# Patient Record
Sex: Female | Born: 1975
Health system: Southern US, Community
[De-identification: ages and names within clinical notes are randomized; demographics above are authoritative.]

## PROBLEM LIST (undated history)

## (undated) DIAGNOSIS — R112 Nausea with vomiting, unspecified: Secondary | ICD-10-CM

## (undated) DIAGNOSIS — F419 Anxiety disorder, unspecified: Secondary | ICD-10-CM

## (undated) DIAGNOSIS — B009 Herpesviral infection, unspecified: Secondary | ICD-10-CM

## (undated) DIAGNOSIS — Z9889 Other specified postprocedural states: Secondary | ICD-10-CM

## (undated) HISTORY — DX: Herpesviral infection, unspecified: B00.9

## (undated) HISTORY — PX: BREAST BIOPSY: SHX20

## (undated) HISTORY — PX: WISDOM TOOTH EXTRACTION: SHX21

## (undated) HISTORY — DX: Anxiety disorder, unspecified: F41.9

---

## 1998-12-22 ENCOUNTER — Ambulatory Visit (HOSPITAL_COMMUNITY): Admission: RE | Admit: 1998-12-22 | Discharge: 1998-12-22 | Payer: Self-pay | Admitting: Obstetrics and Gynecology

## 1999-03-05 ENCOUNTER — Inpatient Hospital Stay (HOSPITAL_COMMUNITY): Admission: AD | Admit: 1999-03-05 | Discharge: 1999-03-07 | Payer: Self-pay | Admitting: Obstetrics and Gynecology

## 2000-10-10 ENCOUNTER — Encounter: Payer: Self-pay | Admitting: Obstetrics and Gynecology

## 2000-10-10 ENCOUNTER — Ambulatory Visit (HOSPITAL_COMMUNITY): Admission: RE | Admit: 2000-10-10 | Discharge: 2000-10-10 | Payer: Self-pay | Admitting: Obstetrics and Gynecology

## 2001-02-27 ENCOUNTER — Encounter: Payer: Self-pay | Admitting: Obstetrics and Gynecology

## 2001-02-27 ENCOUNTER — Inpatient Hospital Stay (HOSPITAL_COMMUNITY): Admission: RE | Admit: 2001-02-27 | Discharge: 2001-02-27 | Payer: Self-pay | Admitting: Obstetrics and Gynecology

## 2001-03-08 ENCOUNTER — Inpatient Hospital Stay (HOSPITAL_COMMUNITY): Admission: AD | Admit: 2001-03-08 | Discharge: 2001-03-10 | Payer: Self-pay | Admitting: Obstetrics and Gynecology

## 2002-01-15 ENCOUNTER — Ambulatory Visit (HOSPITAL_COMMUNITY): Admission: RE | Admit: 2002-01-15 | Discharge: 2002-01-15 | Payer: Self-pay | Admitting: Family Medicine

## 2002-01-15 ENCOUNTER — Encounter: Payer: Self-pay | Admitting: Family Medicine

## 2002-01-24 ENCOUNTER — Encounter: Payer: Self-pay | Admitting: Family Medicine

## 2002-01-24 ENCOUNTER — Encounter: Admission: RE | Admit: 2002-01-24 | Discharge: 2002-01-24 | Payer: Self-pay | Admitting: Family Medicine

## 2002-05-25 ENCOUNTER — Other Ambulatory Visit: Admission: RE | Admit: 2002-05-25 | Discharge: 2002-05-25 | Payer: Self-pay | Admitting: Obstetrics and Gynecology

## 2002-10-24 ENCOUNTER — Encounter: Payer: Self-pay | Admitting: Internal Medicine

## 2002-10-24 ENCOUNTER — Encounter: Admission: RE | Admit: 2002-10-24 | Discharge: 2002-10-24 | Payer: Self-pay | Admitting: Internal Medicine

## 2003-03-22 ENCOUNTER — Other Ambulatory Visit: Admission: RE | Admit: 2003-03-22 | Discharge: 2003-03-22 | Payer: Self-pay | Admitting: Obstetrics and Gynecology

## 2003-05-13 ENCOUNTER — Ambulatory Visit (HOSPITAL_COMMUNITY): Admission: RE | Admit: 2003-05-13 | Discharge: 2003-05-13 | Payer: Self-pay | Admitting: Obstetrics and Gynecology

## 2003-05-13 ENCOUNTER — Encounter: Payer: Self-pay | Admitting: Obstetrics and Gynecology

## 2003-09-27 ENCOUNTER — Inpatient Hospital Stay (HOSPITAL_COMMUNITY): Admission: AD | Admit: 2003-09-27 | Discharge: 2003-09-29 | Payer: Self-pay | Admitting: Obstetrics and Gynecology

## 2004-06-25 ENCOUNTER — Other Ambulatory Visit: Admission: RE | Admit: 2004-06-25 | Discharge: 2004-06-25 | Payer: Self-pay | Admitting: Obstetrics and Gynecology

## 2004-07-07 ENCOUNTER — Encounter: Admission: RE | Admit: 2004-07-07 | Discharge: 2004-07-07 | Payer: Self-pay | Admitting: Obstetrics and Gynecology

## 2004-10-21 ENCOUNTER — Encounter: Admission: RE | Admit: 2004-10-21 | Discharge: 2004-10-21 | Payer: Self-pay | Admitting: Obstetrics and Gynecology

## 2005-03-10 ENCOUNTER — Encounter: Admission: RE | Admit: 2005-03-10 | Discharge: 2005-03-10 | Payer: Self-pay | Admitting: Obstetrics and Gynecology

## 2005-07-16 ENCOUNTER — Encounter: Admission: RE | Admit: 2005-07-16 | Discharge: 2005-07-16 | Payer: Self-pay | Admitting: Obstetrics and Gynecology

## 2005-08-13 ENCOUNTER — Other Ambulatory Visit: Admission: RE | Admit: 2005-08-13 | Discharge: 2005-08-13 | Payer: Self-pay | Admitting: Obstetrics and Gynecology

## 2005-12-13 ENCOUNTER — Ambulatory Visit: Payer: Self-pay | Admitting: Family Medicine

## 2005-12-20 ENCOUNTER — Ambulatory Visit: Payer: Self-pay

## 2006-01-06 ENCOUNTER — Ambulatory Visit: Payer: Self-pay | Admitting: Gastroenterology

## 2006-05-13 ENCOUNTER — Ambulatory Visit: Payer: Self-pay | Admitting: Gastroenterology

## 2006-05-13 ENCOUNTER — Encounter: Admission: RE | Admit: 2006-05-13 | Discharge: 2006-05-13 | Payer: Self-pay | Admitting: Obstetrics and Gynecology

## 2006-09-09 ENCOUNTER — Ambulatory Visit: Payer: Self-pay | Admitting: Family Medicine

## 2006-09-12 ENCOUNTER — Ambulatory Visit (HOSPITAL_COMMUNITY): Admission: RE | Admit: 2006-09-12 | Discharge: 2006-09-12 | Payer: Self-pay | Admitting: Gastroenterology

## 2006-10-07 ENCOUNTER — Ambulatory Visit: Payer: Self-pay | Admitting: Family Medicine

## 2006-10-14 ENCOUNTER — Ambulatory Visit: Payer: Self-pay | Admitting: Family Medicine

## 2006-11-29 ENCOUNTER — Ambulatory Visit: Payer: Self-pay | Admitting: Family Medicine

## 2006-12-23 ENCOUNTER — Ambulatory Visit: Payer: Self-pay | Admitting: Family Medicine

## 2007-01-31 ENCOUNTER — Ambulatory Visit: Payer: Self-pay | Admitting: Cardiology

## 2007-05-05 ENCOUNTER — Ambulatory Visit: Payer: Self-pay | Admitting: Family Medicine

## 2007-05-05 DIAGNOSIS — F411 Generalized anxiety disorder: Secondary | ICD-10-CM | POA: Insufficient documentation

## 2007-05-05 DIAGNOSIS — M542 Cervicalgia: Secondary | ICD-10-CM | POA: Insufficient documentation

## 2007-06-19 ENCOUNTER — Telehealth (INDEPENDENT_AMBULATORY_CARE_PROVIDER_SITE_OTHER): Payer: Self-pay | Admitting: *Deleted

## 2007-07-18 ENCOUNTER — Telehealth (INDEPENDENT_AMBULATORY_CARE_PROVIDER_SITE_OTHER): Payer: Self-pay | Admitting: *Deleted

## 2007-11-23 ENCOUNTER — Telehealth (INDEPENDENT_AMBULATORY_CARE_PROVIDER_SITE_OTHER): Payer: Self-pay | Admitting: *Deleted

## 2008-04-23 ENCOUNTER — Telehealth (INDEPENDENT_AMBULATORY_CARE_PROVIDER_SITE_OTHER): Payer: Self-pay | Admitting: *Deleted

## 2008-06-06 ENCOUNTER — Telehealth (INDEPENDENT_AMBULATORY_CARE_PROVIDER_SITE_OTHER): Payer: Self-pay | Admitting: *Deleted

## 2008-06-07 ENCOUNTER — Ambulatory Visit: Payer: Self-pay | Admitting: Family Medicine

## 2008-10-21 ENCOUNTER — Ambulatory Visit: Payer: Self-pay | Admitting: Family Medicine

## 2008-10-21 DIAGNOSIS — D239 Other benign neoplasm of skin, unspecified: Secondary | ICD-10-CM | POA: Insufficient documentation

## 2008-10-21 LAB — CONVERTED CEMR LAB
Albumin: 4.2 g/dL (ref 3.5–5.2)
Alkaline Phosphatase: 46 units/L (ref 39–117)
Basophils Absolute: 0 10*3/uL (ref 0.0–0.1)
Basophils Relative: 0.1 % (ref 0.0–3.0)
Bilirubin Urine: NEGATIVE
Bilirubin, Direct: 0.2 mg/dL (ref 0.0–0.3)
Blood in Urine, dipstick: NEGATIVE
CO2: 29 meq/L (ref 19–32)
Calcium: 9.2 mg/dL (ref 8.4–10.5)
Creatinine, Ser: 0.7 mg/dL (ref 0.4–1.2)
Eosinophils Absolute: 0.1 10*3/uL (ref 0.0–0.7)
Eosinophils Relative: 1.2 % (ref 0.0–5.0)
Glucose, Urine, Semiquant: NEGATIVE
HCT: 41.9 % (ref 36.0–46.0)
Ketones, urine, test strip: NEGATIVE
LDL Cholesterol: 79 mg/dL (ref 0–99)
Lymphocytes Relative: 29.7 % (ref 12.0–46.0)
MCHC: 35.3 g/dL (ref 30.0–36.0)
MCV: 88.2 fL (ref 78.0–100.0)
Monocytes Absolute: 0.4 10*3/uL (ref 0.1–1.0)
Neutro Abs: 3.2 10*3/uL (ref 1.4–7.7)
Nitrite: NEGATIVE
Protein, U semiquant: NEGATIVE
Sodium: 141 meq/L (ref 135–145)
Specific Gravity, Urine: 1.025
Total Bilirubin: 1.2 mg/dL (ref 0.3–1.2)
Total Protein: 7.1 g/dL (ref 6.0–8.3)
pH: 5

## 2008-10-22 ENCOUNTER — Encounter (INDEPENDENT_AMBULATORY_CARE_PROVIDER_SITE_OTHER): Payer: Self-pay | Admitting: *Deleted

## 2008-10-22 ENCOUNTER — Encounter: Payer: Self-pay | Admitting: Family Medicine

## 2008-10-23 ENCOUNTER — Encounter (INDEPENDENT_AMBULATORY_CARE_PROVIDER_SITE_OTHER): Payer: Self-pay | Admitting: *Deleted

## 2008-11-04 ENCOUNTER — Telehealth (INDEPENDENT_AMBULATORY_CARE_PROVIDER_SITE_OTHER): Payer: Self-pay | Admitting: *Deleted

## 2009-05-09 ENCOUNTER — Telehealth (INDEPENDENT_AMBULATORY_CARE_PROVIDER_SITE_OTHER): Payer: Self-pay | Admitting: *Deleted

## 2009-06-10 ENCOUNTER — Telehealth (INDEPENDENT_AMBULATORY_CARE_PROVIDER_SITE_OTHER): Payer: Self-pay | Admitting: *Deleted

## 2009-06-19 ENCOUNTER — Ambulatory Visit: Payer: Self-pay | Admitting: Family Medicine

## 2009-11-10 ENCOUNTER — Ambulatory Visit: Payer: Self-pay | Admitting: Family Medicine

## 2009-11-10 DIAGNOSIS — N760 Acute vaginitis: Secondary | ICD-10-CM | POA: Insufficient documentation

## 2009-11-10 LAB — CONVERTED CEMR LAB
Blood in Urine, dipstick: NEGATIVE
Glucose, Urine, Semiquant: NEGATIVE
Protein, U semiquant: NEGATIVE
WBC Urine, dipstick: NEGATIVE
Whiff Test: NEGATIVE
pH: 6

## 2009-12-31 ENCOUNTER — Encounter: Payer: Self-pay | Admitting: Family Medicine

## 2010-02-12 ENCOUNTER — Ambulatory Visit: Payer: Self-pay | Admitting: Family Medicine

## 2010-02-12 DIAGNOSIS — Z87898 Personal history of other specified conditions: Secondary | ICD-10-CM | POA: Insufficient documentation

## 2010-02-16 ENCOUNTER — Ambulatory Visit: Payer: Self-pay | Admitting: Family Medicine

## 2010-02-18 ENCOUNTER — Telehealth (INDEPENDENT_AMBULATORY_CARE_PROVIDER_SITE_OTHER): Payer: Self-pay | Admitting: *Deleted

## 2010-02-18 LAB — CONVERTED CEMR LAB
ALT: 15 units/L (ref 0–35)
Albumin: 4.3 g/dL (ref 3.5–5.2)
BUN: 9 mg/dL (ref 6–23)
Basophils Absolute: 0 10*3/uL (ref 0.0–0.1)
Basophils Relative: 0.7 % (ref 0.0–3.0)
Bilirubin, Direct: 0.1 mg/dL (ref 0.0–0.3)
CO2: 27 meq/L (ref 19–32)
Creatinine, Ser: 0.8 mg/dL (ref 0.4–1.2)
Eosinophils Absolute: 0.1 10*3/uL (ref 0.0–0.7)
Eosinophils Relative: 1.1 % (ref 0.0–5.0)
HDL: 60.6 mg/dL (ref >39.00)
LDL Cholesterol: 75 mg/dL (ref 0–99)
Lymphocytes Relative: 26.9 % (ref 12.0–46.0)
MCV: 88.7 fL (ref 78.0–100.0)
Monocytes Absolute: 0.3 10*3/uL (ref 0.1–1.0)
Monocytes Relative: 5.4 % (ref 3.0–12.0)
Neutro Abs: 3.6 10*3/uL (ref 1.4–7.7)
Platelets: 202 10*3/uL (ref 150.0–400.0)
RDW: 14 % (ref 11.5–14.6)
Total Bilirubin: 0.9 mg/dL (ref 0.3–1.2)

## 2010-03-05 ENCOUNTER — Ambulatory Visit: Payer: Self-pay | Admitting: Family Medicine

## 2010-03-05 DIAGNOSIS — J029 Acute pharyngitis, unspecified: Secondary | ICD-10-CM | POA: Insufficient documentation

## 2010-03-06 ENCOUNTER — Encounter: Payer: Self-pay | Admitting: Family Medicine

## 2010-11-23 ENCOUNTER — Telehealth: Payer: Self-pay | Admitting: Family Medicine

## 2010-11-24 NOTE — Progress Notes (Signed)
Summary: Labs   Phone Note Outgoing Call   Call placed by: Army Fossa CMA,  February 18, 2010 9:55 AM Reason for Call: Discuss lab or test results Summary of Call: Regarding lab results, Left message with a female:  HIV, RPR- Normal All other labs also Normal (mailed copy to pt).   Follow-up for Phone Call        Pt is aware. Army Fossa CMA  February 18, 2010 10:15 AM

## 2010-11-24 NOTE — Letter (Signed)
Summary: [facility]  [facility]   Imported By: Lanelle Bal 02/18/2010 12:14:45  _____________________________________________________________________  External Attachment:    Type:   Image     Comment:   External Document

## 2010-11-24 NOTE — Assessment & Plan Note (Signed)
Summary: sore throat/cbs   Vital Signs:  Patient profile:   35 year old female Weight:      105.13 pounds Temp:     97.5 degrees F oral Pulse rate:   76 / minute Pulse rhythm:   regular BP sitting:   106 / 70  (left arm) Cuff size:   regular  Vitals Entered By: Army Fossa CMA (Mar 05, 2010 11:25 AM) CC: Pt here c/o sore throat, body aches since sunday., URI symptoms   History of Present Illness:       This is a 35 year old woman who presents with URI symptoms.  The symptoms began 5 days ago.  The patient complains of sore throat, but denies nasal congestion, clear nasal discharge, purulent nasal discharge, dry cough, productive cough, earache, and sick contacts.  The patient denies fever, low-grade fever (<100.5 degrees), fever of 100.5-103 degrees, fever of 103.1-104 degrees, fever to >104 degrees, stiff neck, dyspnea, wheezing, rash, vomiting, diarrhea, use of an antipyretic, and response to antipyretic.  The patient denies itchy watery eyes, itchy throat, sneezing, seasonal symptoms, response to antihistamine, headache, muscle aches, and severe fatigue.  The patient denies the following risk factors for Strep sinusitis: unilateral facial pain, unilateral nasal discharge, poor response to decongestant, double sickening, tooth pain, Strep exposure, tender adenopathy, and absence of cough.    Allergies (verified): No Known Drug Allergies  Past History:  Past medical, surgical, family and social histories (including risk factors) reviewed for relevance to current acute and chronic problems.  Past Medical History: Reviewed history from 02/12/2010 and no changes required. Anxiety HSV II  Past Surgical History: Reviewed history from 10/21/2008 and no changes required. Denies surgical history  Family History: Reviewed history from 05/05/2007 and no changes required. Family History of Anxiety  Social History: Reviewed history from 10/21/2008 and no changes  required. Occupation:  Preschool Married Current Smoker Alcohol use-no Drug use-no Regular exercise-no  Review of Systems      See HPI  Physical Exam  General:  Well-developed,well-nourished,in no acute distress; alert,appropriate and cooperative throughout examination Ears:  External ear exam shows no significant lesions or deformities.  Otoscopic examination reveals clear canals, tympanic membranes are intact bilaterally without bulging, retraction, inflammation or discharge. Hearing is grossly normal bilaterally. Nose:  External nasal examination shows no deformity or inflammation. Nasal mucosa are pink and moist without lesions or exudates. Mouth:  pharyngeal erythema and postnasal drip.   Neck:  No deformities, masses, or tenderness noted. Lungs:  Normal respiratory effort, chest expands symmetrically. Lungs are clear to auscultation, no crackles or wheezes. Heart:  normal rate and no murmur.   Psych:  Oriented X3 and normally interactive.     Impression & Recommendations:  Problem # 1:  ACUTE PHARYNGITIS (ICD-462)  Her updated medication list for this problem includes:    Amoxicillin 875 Mg Tabs (Amoxicillin) .Marland Kitchen... 1 by mouth two times a day     take zyrtec or allegra daily  Orders: T-Culture, Throat ([id number])  Instructed to complete antibiotics and call if not improved in 48 hours.   Complete Medication List: 1)  Amoxicillin 875 Mg Tabs (Amoxicillin) .Marland Kitchen.. 1 by mouth two times a day Prescriptions: AMOXICILLIN 875 MG TABS (AMOXICILLIN) 1 by mouth two times a day  #20 x 0   Entered by:   Shonna Chock   Authorized by:   Loreen Freud DO   Signed by:   Shonna Chock on 03/05/2010   Method used:   Print then Give  to Patient   RxID:   [id number] AMOXICILLIN 875 MG TABS (AMOXICILLIN) 1 by mouth two times a day  #20 x 0   Entered and Authorized by:   Loreen Freud DO   Signed by:   Loreen Freud DO on 03/05/2010   Method used:   Print then Give to Patient   RxID:    [id number]   Laboratory Results    Other Tests  Rapid Strep: negative Comments: Army Fossa CMA  Mar 05, 2010 11:37 AM

## 2010-11-24 NOTE — Assessment & Plan Note (Signed)
Summary: CPX,FASTING,NO PAP,[employer]/RH......Marland Kitchen   Vital Signs:  Patient profile:   35 year old female Height:      66 inches Weight:      108 pounds Pulse rate:   72 / minute Pulse rhythm:   regular BP sitting:   102 / 62  (left arm) Cuff size:   regular  Vitals Entered By: Army Fossa CMA (February 12, 2010 9:06 AM) CC: Pt here for CPX, not fasting- she states she was not told, no pap   History of Present Illness: Pt here for cpe no pap.  Pt with no complaint  Pt dx with HSV II by gyn --- her husband has it as well.  She would like to be tested for other stds because her and her husband were separated for a while and she is not sure if he was with someone else or not.  Pt was tested for GC and Chlamydia by gyn.    Preventive Screening-Counseling & Management  Alcohol-Tobacco     Alcohol drinks/day: <1     Alcohol type: beer     Smoking Status: current     Smoking Cessation Counseling: yes     Smoke Cessation Stage: contemplative     Packs/Day: <1 cig a day     Year Started: 1990  Caffeine-Diet-Exercise     Caffeine use/day: 1     Does Patient Exercise: yes     Type of exercise: tennis     Exercise (avg: min/session): 30-60     Times/week: 3  Hep-HIV-STD-Contraception     Dental Visit-last 6 months yes     Dental Care Counseling: not indicated; dental care within six months     SBE monthly: no     SBE Education/Counseling: to perform regular SBE      Sexual History:  currently monogamous and they were separated --suspect husband was with someone else.    Current Medications (verified): 1)  None  Allergies (verified): No Known Drug Allergies  Past History:  Past Surgical History: Last updated: 10/21/2008 Denies surgical history  Family History: Last updated: 05/05/2007 Family History of Anxiety  Social History: Last updated: 10/21/2008 Occupation:  Preschool Married Current Smoker Alcohol use-no Drug use-no Regular exercise-no  Risk Factors: Alcohol  Use: <1 (02/12/2010) Caffeine Use: 1 (02/12/2010) Exercise: yes (02/12/2010)  Risk Factors: Smoking Status: current (02/12/2010) Packs/Day: <1 cig a day (02/12/2010)  Past Medical History: Anxiety HSV II  Family History: Reviewed history from 05/05/2007 and no changes required. Family History of Anxiety  Social History: Reviewed history from 10/21/2008 and no changes required. Occupation:  Preschool Married Current Smoker Alcohol use-no Drug use-no Regular exercise-no Does Patient Exercise:  yes Dental Care w/in 6 mos.:  yes Sexual History:  currently monogamous, they were separated --suspect husband was with someone else  Review of Systems      See HPI General:  Denies chills, fatigue, fever, loss of appetite, malaise, sleep disorder, sweats, weakness, and weight loss. Eyes:  Denies blurring, discharge, double vision, eye irritation, eye pain, halos, itching, light sensitivity, red eye, vision loss-1 eye, and vision loss-both eyes. ENT:  Denies decreased hearing, difficulty swallowing, ear discharge, earache, hoarseness, nasal congestion, nosebleeds, postnasal drainage, ringing in ears, sinus pressure, and sore throat. CV:  Denies bluish discoloration of lips or nails, chest pain or discomfort, difficulty breathing at night, difficulty breathing while lying down, fainting, fatigue, leg cramps with exertion, lightheadness, near fainting, palpitations, shortness of breath with exertion, swelling of feet, swelling of hands, and  weight gain. Resp:  Denies chest discomfort, chest pain with inspiration, cough, coughing up blood, excessive snoring, hypersomnolence, morning headaches, pleuritic, shortness of breath, sputum productive, and wheezing. GI:  Denies abdominal pain, bloody stools, change in bowel habits, constipation, dark tarry stools, diarrhea, excessive appetite, gas, hemorrhoids, indigestion, loss of appetite, and nausea. GU:  Denies abnormal vaginal bleeding, decreased  libido, discharge, dysuria, genital sores, hematuria, incontinence, nocturia, urinary frequency, and urinary hesitancy. Derm:  Denies changes in color of skin, changes in nail beds, dryness, excessive perspiration, flushing, hair loss, insect bite(s), itching, lesion(s), poor wound healing, and rash; derm---3 moles removed. Neuro:  Denies brief paralysis, difficulty with concentration, disturbances in coordination, falling down, headaches, inability to speak, memory loss, numbness, poor balance, seizures, sensation of room spinning, tingling, tremors, visual disturbances, and weakness. Psych:  Denies alternate hallucination ( auditory/visual), anxiety, depression, easily angered, easily tearful, irritability, mental problems, panic attacks, sense of great danger, suicidal thoughts/plans, thoughts of violence, unusual visions or sounds, and thoughts /plans of harming others. Endo:  Denies cold intolerance, excessive hunger, excessive thirst, excessive urination, heat intolerance, polyuria, and weight change. Heme:  Denies abnormal bruising, bleeding, enlarge lymph nodes, fevers, pallor, and skin discoloration. Allergy:  Denies hives or rash, itching eyes, persistent infections, seasonal allergies, and sneezing.  Physical Exam  General:  Well-developed,well-nourished,in no acute distress; alert,appropriate and cooperative throughout examination Head:  Normocephalic and atraumatic without obvious abnormalities. No apparent alopecia or balding. Eyes:  pupils equal, pupils round, pupils reactive to light, and no injection.   Ears:  External ear exam shows no significant lesions or deformities.  Otoscopic examination reveals clear canals, tympanic membranes are intact bilaterally without bulging, retraction, inflammation or discharge. Hearing is grossly normal bilaterally. Nose:  External nasal examination shows no deformity or inflammation. Nasal mucosa are pink and moist without lesions or  exudates. Mouth:  Oral mucosa and oropharynx without lesions or exudates.  Teeth in good repair. Neck:  No deformities, masses, or tenderness noted. Breasts:  gyn Lungs:  Normal respiratory effort, chest expands symmetrically. Lungs are clear to auscultation, no crackles or wheezes. Heart:  normal rate and no murmur.   Abdomen:  Bowel sounds positive,abdomen soft and non-tender without masses, organomegaly or hernias noted. Genitalia:  gyn Msk:  normal ROM, no joint tenderness, no joint swelling, no joint warmth, no redness over joints, no joint deformities, no joint instability, and no crepitation.   Pulses:  R and L carotid,radial,femoral,dorsalis pedis and posterior tibial pulses are full and equal bilaterally Extremities:  No clubbing, cyanosis, edema, or deformity noted with normal full range of motion of all joints.   Neurologic:  No cranial nerve deficits noted. Station and gait are normal. Plantar reflexes are down-going bilaterally. DTRs are symmetrical throughout. Sensory, motor and coordinative functions appear intact. Skin:  Intact without suspicious lesions or rashes Cervical Nodes:  No lymphadenopathy noted Axillary Nodes:  No palpable lymphadenopathy Psych:  Cognition and judgment appear intact. Alert and cooperative with normal attention span and concentration. No apparent delusions, illusions, hallucinations   Impression & Recommendations:  Problem # 1:  PREVENTIVE HEALTH CARE (ICD-V70.0) check fasting labs ghm utd pap per gyn  Problem # 2:  GENITAL HERPES, HX OF (ICD-V13.8)  Problem # 3:  ANXIETY (ICD-300.00)  The following medications were removed from the medication list:    Alprazolam 0.25 Mg Tabs (Alprazolam) .Marland Kitchen... Take one tablet daily  Patient Instructions: 1)  fasting labs--- V70.0  cbcd, bmp, hep, lipid, TSH, HIV, RPR,  UA

## 2010-11-24 NOTE — Assessment & Plan Note (Signed)
Summary: CRAMPING-FEMALE ISSUES//CCM   Vital Signs:  Patient profile:   35 year old female Weight:      106 pounds Temp:     98.5 degrees F oral Pulse rate:   68 / minute Pulse rhythm:   regular BP sitting:   100 / 60  (left arm) Cuff size:   regular  Vitals Entered By: Army Fossa CMA (November 10, 2009 1:23 PM) CC: Pt c/o vaginal discharge, cramping and odor. Has tried monistat. , Vaginal discharge   History of Present Illness:  Vaginal discharge      This is a 35 year old woman who presents with Vaginal discharge.  The patient complains of itching, but denies vaginal burning, burning on urination, frequency, urgency, fever, pelvic pain, and back pain.  The discharge is described as white and malodorous.  Risk factors for vaginal discharge include possible STD exposure.  The patient denies the following symptoms: genital sores, unusual vaginal bleeding, painful intercourse, rash, myalgias, arthralgias, and headache.  Prior treatment has included OTC antifungals.    Current Medications (verified): 1)  Alprazolam 0.25 Mg  Tabs (Alprazolam) .... Take One Tablet Daily 2)  Metrogel-Vaginal 0.75 % Gel (Metronidazole) .Marland Kitchen.. 1 Applicator At Bedtime For 5 Days  Allergies (verified): No Known Drug Allergies  Past History:  Past medical, surgical, family and social histories (including risk factors) reviewed for relevance to current acute and chronic problems.  Past Medical History: Reviewed history from 05/05/2007 and no changes required. Anxiety  Past Surgical History: Reviewed history from 10/21/2008 and no changes required. Denies surgical history  Family History: Reviewed history from 05/05/2007 and no changes required. Family History of Anxiety  Social History: Reviewed history from 10/21/2008 and no changes required. Occupation:  Preschool Married Current Smoker Alcohol use-no Drug use-no Regular exercise-no  Review of Systems      See HPI  Physical  Exam  General:  Well-developed,well-nourished,in no acute distress; alert,appropriate and cooperative throughout examination Abdomen:  Bowel sounds positive,abdomen soft and non-tender without masses, organomegaly or hernias noted. Genitalia:  normal introitus, no external lesions, and vaginal discharge.   Psych:  Cognition and judgment appear intact. Alert and cooperative with normal attention span and concentration. No apparent delusions, illusions, hallucinations   Impression & Recommendations:  Problem # 1:  VAGINITIS, BACTERIAL (ICD-616.10) Pt did not want GC and chlmydia sent because of high deductible---she will rto for urine test if symptoms do not improve in 5 days Her updated medication list for this problem includes:    Metrogel-vaginal 0.75 % Gel (Metronidazole) .Marland Kitchen... 1 applicator at bedtime for 5 days  Orders: Urine Pregnancy Test  (21308) UA Dipstick w/o Micro (manual) (65784) Wet Prep (69629BM)  Discussed symptomatic relief and treatment options.   Complete Medication List: 1)  Alprazolam 0.25 Mg Tabs (Alprazolam) .... Take one tablet daily 2)  Metrogel-vaginal 0.75 % Gel (Metronidazole) .Marland Kitchen.. 1 applicator at bedtime for 5 days Prescriptions: METROGEL-VAGINAL 0.75 % GEL (METRONIDAZOLE) 1 applicator at bedtime for 5 days  #5 days x 0   Entered and Authorized by:   Loreen Freud DO   Signed by:   Loreen Freud DO on 11/10/2009   Method used:   Electronically to        [facility] [phone]* (retail)       [address]       [place], [place]  [address]       Ph: [phone]  Fax: [phone]   RxID:   [id number]     Laboratory Results   Urine Tests    Routine Urinalysis   Color: yellow Appearance: Clear Glucose: negative   (Normal Range: Negative) Bilirubin: negative   (Normal Range: Negative) Ketone: negative   (Normal Range: Negative) Spec. Gravity: 1.015   (Normal Range: 1.003-1.035) Blood: negative   (Normal  Range: Negative) pH: 6.0   (Normal Range: 5.0-8.0) Protein: negative   (Normal Range: Negative) Urobilinogen: 0.2   (Normal Range: 0-1) Nitrite: negative   (Normal Range: Negative) Leukocyte Esterace: negative   (Normal Range: Negative)    Urine HCG: negative Comments: Army Fossa CMA  November 10, 2009 1:35 PM Date/Time Received: November 10, 2009 2:23 PM  Date/Time Reported: November 10, 2009 2:23 PM   [employer] Source: vaginal WBC/hpf: 5-10 Bacteria/hpf: 3+ Clue cells/hpf: moderate  Negative whiff Yeast/hpf: none Wet [place]OH: Negative Trichomonas/hpf: none

## 2010-12-02 ENCOUNTER — Encounter: Payer: Self-pay | Admitting: Family Medicine

## 2010-12-02 ENCOUNTER — Ambulatory Visit (INDEPENDENT_AMBULATORY_CARE_PROVIDER_SITE_OTHER): Payer: Managed Care, Other (non HMO) | Admitting: Family Medicine

## 2010-12-02 DIAGNOSIS — F411 Generalized anxiety disorder: Secondary | ICD-10-CM

## 2010-12-02 NOTE — Progress Notes (Signed)
Summary: Generic xanax refill  Phone Note Refill Request Call back at Work Phone [phone] Message from:  Patient on November 23, 2010 2:23 PM  Refills Requested: Medication #1:  Prudy Feeler   Notes: [facility]S [facility]  Next Appointment Scheduled: none Initial call taken by: Jerolyn Shin,  November 23, 2010 2:24 PM  Follow-up for Phone Call        removed from list in 4/ 2011.... Last seen 03/06/10 Will patient need and OV??   Left message to call back... Almeta Monas CMA Duncan Dull)  November 23, 2010 2:57 PM  spoke with patient and she stated she needed it for the same reason she needed it before. Will she need an OV...Marland Kitchen.. Almeta Monas CMA Duncan Dull)  November 23, 2010 2:58 PM  Follow-up by: Almeta Monas CMA Duncan Dull),  November 23, 2010 2:54 PM    Additional Follow-up for Phone Call Additional follow up Details #2::    Yes---needs ov---shes been off of it for more than 6 months Follow-up by: Loreen Freud DO,  November 23, 2010 5:05 PM  Additional Follow-up for Phone Call Additional follow up Details #3:: Details for Additional Follow-up Action Taken: spoke with patient and made her aware of the above and she stated she will call back to schedule... Almeta Monas CMA Duncan Dull)  November 24, 2010 1:13 PM

## 2010-12-10 NOTE — Assessment & Plan Note (Signed)
Summary: followup for Xanax refill///sph  Medications Added ALPRAZOLAM 0.5 MG TABS (ALPRAZOLAM) 1 by mouth three times a day as needed      Allergies Added: NKDA Nurse Visit   Vital Signs:  Patient profile:   35 year old female Height:      66 inches Weight:      108.8 pounds BMI:     17.62 Pulse rate:   64 / minute Pulse rhythm:   regular BP sitting:   96 / 60  (left arm) Cuff size:   regular  Vitals Entered By: Almeta Monas CMA Duncan Dull) (December 02, 2010 8:24 AM)  History of Present Illness: Pt here c/o increased anxiety secondary to going through divorce and best friends father dropping dead of MI at 6yo.  Pt states she doesn't need it often.    CC: wants to discuss restarting Xanax   Preventive Screening-Counseling & Management  Alcohol-Tobacco     Alcohol drinks/day: <1     Alcohol type: beer     Smoking Status: current     Smoking Cessation Counseling: yes     Smoke Cessation Stage: contemplative     Packs/Day: <1 cig a day     Year Started: 1990  Caffeine-Diet-Exercise     Caffeine use/day: 1     Does Patient Exercise: yes     Type of exercise: tennis     Exercise (avg: min/session): 30-60     Times/week: 3  Problems Prior to Update: 1)  Acute Pharyngitis  (ICD-462) 2)  Genital Herpes, Hx of  (ICD-V13.8) 3)  Vaginitis, Bacterial  (ICD-616.10) 4)  Preventive Health Care  (ICD-V70.0) 5)  Nevi, Multiple  (ICD-216.9) 6)  Neck Pain, Acute  (ICD-723.1) 7)  Anxiety  (ICD-300.00) 8)  Fh of Panic Attack  (ICD-300.01)  Medications Prior to Update: 1)  None  Current Medications (verified): 1)  Alprazolam 0.5 Mg Tabs (Alprazolam) .Marland Kitchen.. 1 By Mouth Three Times A Day As Needed  Allergies (verified): No Known Drug Allergies  Orders Added: 1)  Est. Patient Level III [[address]109]  Physical Exam  General:  Well-developed,well-nourished,in no acute distress; alert,appropriate and cooperative throughout examination Psych:  Oriented X3, normally interactive, good  eye contact, not anxious appearing, and not depressed appearing.     Impression & Recommendations:  Problem # 1:  ANXIETY (ICD-300.00) Pt aware that if she needs to take med more than 1x a day we need to add medication for daily use Her updated medication list for this problem includes:    Alprazolam 0.5 Mg Tabs (Alprazolam) .Marland Kitchen... 1 by mouth three times a day as needed  Discussed medication use and relaxation techniques.   Complete Medication List: 1)  Alprazolam 0.5 Mg Tabs (Alprazolam) .Marland Kitchen.. 1 by mouth three times a day as needed  Prescriptions: ALPRAZOLAM 0.5 MG TABS (ALPRAZOLAM) 1 by mouth three times a day as needed  #30 x 1   Entered and Authorized by:   Loreen Freud DO   Signed by:   Loreen Freud DO on 12/02/2010   Method used:   Print then Give to Patient   RxID:   [id number]

## 2010-12-19 ENCOUNTER — Encounter: Payer: Self-pay | Admitting: Family Medicine

## 2011-03-12 NOTE — Discharge Summary (Signed)
[facility]  Patient:    Mckenzie Harris, Mckenzie Harris                        MRN: [id number] Adm. Date:  [id number] Disc. Date: [id number] Attending:  Michaele Offer                           Discharge Summary  ADDENDUM TO PREVIOUS DISCHARGE SUMMARY  NOTE:                         Please see previous discharge summary dictated by Dr. Tracey Harries.  ADDENDUM:                     The patient had requested discharge on the evening of Mar 09, 2001; however, due to possible jaundice in the baby, the pediatrician would not let the baby go so she was kept overnight. She remained afebrile and had no complications, and on then on the morning of May 17 was felt to be stable enough for discharge home. They were awaiting discussion with the pediatrician regarding followup for jaundice.  DISCHARGE INSTRUCTIONS:       Discharge instructions are the same and she is to call with any questions. DD:  03/10/01 TD:  03/10/01 Job: [id number]

## 2011-03-12 NOTE — Assessment & Plan Note (Signed)
[facility]                            CARDIOLOGY OFFICE NOTE   Mckenzie Harris, Mckenzie Harris                        MRN:          [id number]  DATE:01/31/2007                            DOB:          [birth year]    PRIMARY CARE PHYSICIAN:  Dr. Loreen Freud.   REASON FOR PRESENTATION:  Evaluate patient with palpitations and chest  pain.   HISTORY OF PRESENT ILLNESS:  Patient is a pleasant 35 year old white  female. She did have chest discomfort last year and had a stress  perfusion study. This demonstrated an ejection fraction of 62%. There  was no evidence of ischemia or infarct. She walked for 11 minutes.   She says that at that time her discomfort went away. However, it has  returned over the last 2 months or so. It happens sporadically. She cant  not reproduce this with activity. It seems to happen at rest. It is  mild. She describes some chest pressure with some vague arm discomfort.  She does not have any associated nausea, vomiting, or diaphoresis. She  does not have any decreased exercise tolerance. She is able to such  things as walk on an elliptical without bringing on this discomfort. She  also describes occasional palpitations. These seem to be isolated  skipped beats. They happen rarely. They may happen with emotional  stress. She can not otherwise bring them on. She does not have  associated pre syncope or syncope. Of note the patient has recently  started Paxil and thinks that some of these symptoms have improved.   PAST MEDICAL HISTORY:  Recent diagnosis of anxiety.   PAST SURGICAL HISTORY:  None.   ALLERGIES:  None.   MEDICATIONS:  Paxil.   SOCIAL HISTORY:  The patient is married and has 3 children ages 12, 35,  and 61. She does not drink caffeine. She smoked a few cigarettes in  college but does not any more. She does not drink alcohol.   FAMILY HISTORY:  Noncontributory for early coronary artery disease.   REVIEW OF SYSTEMS:  As  stated in the HPI and positive for reflux  (symptoms are not similar to the above), rare dizziness. Negative for  other systems.   PHYSICAL EXAMINATION:  The patient is thin. She is in no distress. She  has a pleasant affect. Blood pressure 95/62, hear rate 76 and regular,  body mass index 16, weight 98 pounds.  HEENT: Eyelids unremarkable. Pupils equal, round, and reactive to light.  Fundi within normal limits. Oral mucosa unremarkable.  NECK: No jugular venous distension. Wave form within normal limits.  Carotid upstrokes brisk and symmetric. No bruits or thyromegaly.  LYMPHATICS: No cervical, axillary, or inguinal adenopathy.  LUNGS: Clear to auscultation bilaterally.  BACK: No costovertebral angle tenderness.  CHEST: Unremarkable.  HEART: PMI not displaced or sustained. S1 and S2 within normal limits.  No S3. No S4, clicks, rubs, murmurs.  ABDOMEN: Flat, positive bowel sounds, normal to frequency and pitch. No  bruits, rebound, guarding. No midline pulsatile mass, no hepatomegaly,  splenomegaly.  SKIN: No rashes. No nodules.  EXTREMITIES: 2 +  pulses, no edema. No cyanosis or clubbing.  NEURO: Oriented to person, place, and time. Cranial nerves II-XII  grossly intact. Motor grossly intact.   EKG: Sinus rhythm, rate 65, axis within normal limits, intervals within  normal limits. No acute ST-T wave changes.   ASSESSMENT/PLAN:  1. Chest discomfort, the patient's chest discomfort is atypical. She      had negative stress perfusion study last year. She had an excellent      exercise tolerance. She has no significant risk factors and no      objective findings of ischemia. I think the pretest probability of      this pain being from obstructive coronary disease is extremely low      and does not warrant further testing. Likewise there does not seem      to be another cardiac etiology. At this point I will refer her back      to her primary care doctor to consider other non cardiac  causes of      this discomfort.   1. Palpitations, patient does have palpitations which may be PACs or      PVCs. However, they are rare. They are not particularly      symptomatic. She thinks she has had her thyroid tested by her      gastroenterologist recently and I have encouraged her to make sure      this is the case. Unless the symptoms become more problematic I      would not pursue further evaluation. The patient and I had a long      discussion about this.   1. Follow up, I will see her back as needed.     Rollene Rotunda, MD, [facility]  Electronically Signed    JH/MedQ  DD: 01/31/2007  DT: 01/31/2007  Job #: [id number]   cc:   Lelon Perla, DO

## 2011-03-12 NOTE — Discharge Summary (Signed)
Mckenzie Harris, BARSCH                           ACCOUNT NO.:  [id number]   MEDICAL RECORD NO.:  [id number]                   PATIENT TYPE:  INP   LOCATION:  9137                                 FACILITY:  [facility]   PHYSICIAN:  Zenaida Niece, M.D.             DATE OF BIRTH:  1976/05/12   DATE OF ADMISSION:  09/27/2003  DATE OF DISCHARGE:  09/29/2003                                 DISCHARGE SUMMARY   ADMISSION DIAGNOSES:  1. Intrauterine pregnancy at 38 weeks.  2. Group B strep carrier.   DISCHARGE DIAGNOSES:  1. Intrauterine pregnancy  at 38 weeks.  2. Group B strep carrier.   PROCEDURE:  On September 27, 2003 she had a vacuum assisted vaginal delivery.   HISTORY OF PRESENT ILLNESS:  This is a 35 year old white female gravida 3,  para 2-0-0-2 with an estimated gestational age of 38+ weeks who presents  with complaint of regular contractions. On initial evaluation, she was 6 cm  dilated. Prenatal care was essentially uncomplicated.   PRENATAL LABORATORY DATA:  Blood type A+ with a negative antibody screen.  RPR non-reactive. Rubella immune, hepatitis B surface antigen negative. HIV  negative. Gonorrhea and Chlamydia negative. Cystic fibrosis screening  negative. Triple screen normal. One hour Glucola is 90 and group B strep is  negative.   PAST OBSTETRIC HISTORY:  In 2000, she had a spontaneous vaginal delivery at  38 weeks, 6 pounds and 13 ounces and that was a forceps delivery. In 2002 a  vaginal delivery at 39 weeks, 7 pounds and 8 ounces, no complications.   PAST MEDICAL HISTORY:  The patient has hemoglobin D trait.   PAST SURGICAL HISTORY:  Birthmark removed.   ALLERGIES:  None.   PHYSICAL EXAMINATION:  VITAL SIGNS:  She was afebrile with stable vital  signs. Fetal heart tracing reactive with contractions every 3 to 5 minutes.  ABDOMEN:  Gravid and nontender with an estimated fetal weight of 7 pounds.  GENITOURINARY:  Vaginal examination per the nurse was 6 to 7 cm  dilated.   HOSPITAL COURSE:  The patient was admitted and received an epidural. She had  spontaneous rupture of membranes and continued to progress on her own. She  progressed to complete and pushed fair. Due to some fetal heart rate  decelerations, a vacuum assisted vaginal delivery was attempted. I was able  to make some descent with the vacuum but was not able to accomplish  delivery. Again, due to poor expulsive efforts, I applied fundal pressure to  help deliver the fetal vertex and this was done over a second degree midline  episiotomy. She was then able to deliver a viable female infant with Apgar's  of 8 and 9 that weighed 8 pounds 11 ounces. Placenta delivered spontaneously  and was intact. There was no shoulder dystocia. Her second degree episiotomy  was repaired with 3-0 Vicryl and estimated blood loss was less than 500  cc.  Postpartum she did very well without complications. Pre-delivery hemoglobin  11.2 and post-delivery 9.4. On postpartum day 2, she was stable for  discharge home.   DISCHARGE INSTRUCTIONS:  Regular diet, pelvic rest, and followup in 4 to 6  weeks. She was given our discharge pamphlet.   DISCHARGE MEDICATIONS:  Over-the-counter Ibuprofen p.r.n.                                               Zenaida Niece, M.D.    TDM/MEDQ  D:  09/29/2003  T:  09/29/2003  Job:  [id number]

## 2011-03-12 NOTE — Discharge Summary (Signed)
[facility]  Patient:    Mckenzie Harris, Mckenzie Harris                        MRN: [id number] Adm. Date:  [id number] Disc. Date: [id number] Attending:  Michaele Offer                           Discharge Summary  REASON FOR ADMISSION:         A 35 year old white female, para 1-0-0-1, gravida 2, estimated gestational age 58+ weeks by an 8-week ultrasound with [facility] Mar 12, 2001, presented for induction of labor with a favorable cervix.  PRENATAL LABORATORY DATA:     Blood group and type: A positive with a negative antibody. RPR nonreactive, rubella immune, hepatitis B surface antigen negative, HIV negative, GC and Chlamydia negative, Pap smear normal, triple screen normal, one-hour glucola 112, positive group B strep.  PRENATAL COURSE:              Her prenatal course was essentially uncomplicated. Ultrasound on Feb 27, 2001 for size less than dates with estimated fetal weight greater than 3000 g and AFI of 8, reactive nonstress test.  OBSTETRIC HISTORY:            In May 2000 the patient had a forceps delivery at 38 weeks, 6-pound 13-ounce infant.  PAST MEDICAL HISTORY:         Hemoglobin D trait.  PAST SURGICAL HISTORY:        Birth mark removed.  ALLERGIES:                    No known drug allergies.  MEDICATIONS:                  None.  SOCIAL HISTORY:               Married, no tobacco.  FAMILY HISTORY:               Brother with Goodpasture disease.  PHYSICAL EXAMINATION ON ADMISSION:                 Physical exam on admission revealed normal vital signs. Fetal heart tones were reactive with occasional contractions. The abdomen was gravid and nontender, estimated fetal weight about 6-1/2 to 7 pounds. Vaginal exam: Cervix 3 cm, 50%, vertex at a -1 to -2. Adequate pelvis. Artificial rupture of membranes produced clear fluid.  IMPRESSION:                   1. Intrauterine pregnancy at 39 weeks for                                  induction.                       2. Positive group beta streptococcus by history.  HOSPITAL COURSE:              The patient was placed on penicillin prophylaxis. She received an epidural. By 7:45 p.m. her cervix was 8 cm, vertex, at a +1 station. She progressed to complete and pushed well with a spontaneous vaginal delivery of a living female infant, weight 7 pounds 8 ounces, Apgars 9 at one and 9 at five minutes by Dr. Jackelyn Knife. Placenta was spontaneous and intact. A second-degree laceration  was repaired with 3-0 Vicryl. Blood loss was less than 500 cc.  Postpartum, the patient did well. She requested early discharge. According to the nursery, the baby has to be 24 hours old before it can have the PKU done, but the patient still requests discharge. I have asked the nursery to contact the patients pediatrician and ask if any special discharge instructions. The patient will be discharged.  DISCHARGE LABORATORY DATE:    Hemoglobin of 11.2, hematocrit 33.4, white count 10,800, platelet count 245,000. RPR nonreactive.  FINAL DIAGNOSES:              1. Intrauterine pregnancy at 39+ weeks                                  delivered vertex.                               2. Group beta streptococcus carrier.  OPERATION:                    Spontaneous vaginal delivery with repair of                               midline laceration.  FINAL CONDITION:              Improved.  DISCHARGE INSTRUCTIONS:       Instructions include our regular discharge instruction booklet.  DISCHARGE FOLLOWUP:           The patient is advised to return to the office in six weeks for followup examination.  DISCHARGE MEDICATIONS:        She is given a prescription for Ibuprofen 600 mg, 20 tablets, one every six hours as needed for pain.  DD:  03/09/01 TD:  03/10/01 Job: [id number]

## 2013-06-19 ENCOUNTER — Encounter: Payer: Managed Care, Other (non HMO) | Admitting: Family Medicine

## 2013-07-12 ENCOUNTER — Encounter: Payer: Self-pay | Admitting: Family Medicine

## 2013-07-12 ENCOUNTER — Ambulatory Visit (INDEPENDENT_AMBULATORY_CARE_PROVIDER_SITE_OTHER): Payer: No Typology Code available for payment source | Admitting: Family Medicine

## 2013-07-12 VITALS — BP 98/64 | HR 81 | Temp 98.0°F | Resp 10 | Ht 65.0 in | Wt 106.8 lb

## 2013-07-12 DIAGNOSIS — F411 Generalized anxiety disorder: Secondary | ICD-10-CM

## 2013-07-12 DIAGNOSIS — A6000 Herpesviral infection of urogenital system, unspecified: Secondary | ICD-10-CM

## 2013-07-12 DIAGNOSIS — F419 Anxiety disorder, unspecified: Secondary | ICD-10-CM

## 2013-07-12 DIAGNOSIS — Z0000 Encounter for general adult medical examination without abnormal findings: Secondary | ICD-10-CM

## 2013-07-12 DIAGNOSIS — Z87898 Personal history of other specified conditions: Secondary | ICD-10-CM

## 2013-07-12 MED ORDER — VALACYCLOVIR HCL 1 G PO TABS: 1000.0000 mg | ORAL_TABLET | Freq: Every day | ORAL | Status: DC

## 2013-07-12 MED ORDER — PAROXETINE HCL ER 12.5 MG PO TB24: 12.5000 mg | ORAL_TABLET | ORAL | Status: DC

## 2013-07-12 NOTE — Progress Notes (Signed)
Subjective:     Mckenzie Harris is a 37 y.o. female and is here for a comprehensive physical exam. The patient reports no problems.  History   Social History  . Marital Status: Married    Spouse Name: N/A    Number of Children: N/A  . Years of Education: N/A   Occupational History  . Preschool Teacher    Social History Main Topics  . Smoking status: Current Every Day Smoker -- 1.00 packs/day for 20 years  . Smokeless tobacco: Not on file  . Alcohol Use: Yes  . Drug Use: No  . Sexual Activity: Not Currently   Other Topics Concern  . Not on file   Social History Narrative   Regular exercise: no   Caffeine use: diet coke daily   Health Maintenance  Topic Date Due  . Pap Smear  10/10/2010  . Influenza Vaccine  05/25/2013  . Tetanus/tdap  10/21/2018    The following portions of the patient's history were reviewed and updated as appropriate:  She  has a past medical history of Anxiety and HSV-2 infection. She  does not have any pertinent problems on file. She  has no past surgical history on file. Her family history includes Anxiety disorder in an other family member; Heart disease in her father. She  reports that she has been smoking.  She does not have any smokeless tobacco history on file. She reports that  drinks alcohol. She reports that she does not use illicit drugs. She has a current medication list which includes the following prescription(s): valacyclovir. No current outpatient prescriptions on file prior to visit.   No current facility-administered medications on file prior to visit.   She has no allergies on file..  Review of Systems Review of Systems  Constitutional: Negative for activity change, appetite change and fatigue.  HENT: Negative for hearing loss, congestion, tinnitus and ear discharge.   Eyes: Negative for visual disturbance (see optho q1y -- vision corrected to 20/20 with glasses).  Respiratory: Negative for cough, chest tightness and  shortness of breath.   Cardiovascular: Negative for chest pain, palpitations and leg swelling.  Gastrointestinal: Negative for abdominal pain, diarrhea, constipation and abdominal distention.  Genitourinary: Negative for urgency, frequency, decreased urine volume and difficulty urinating.  Musculoskeletal: Negative for back pain, arthralgias and gait problem.  Skin: Negative for color change, pallor and rash.  Neurological: Negative for dizziness, light-headedness, numbness and headaches.  Hematological: Negative for adenopathy. Does not bruise/bleed easily.  Psychiatric/Behavioral: Negative for suicidal ideas, confusion, sleep disturbance, self-injury, dysphoric mood, decreased concentration and agitation.       Objective:    BP 98/64  Pulse 81  Temp(Src) 98 F (36.7 C) (Oral)  Resp 10  Ht 5\' 5"  (1.651 m)  Wt 106 lb 12.8 oz (48.444 kg)  BMI 17.77 kg/m2  SpO2 98% General appearance: alert, cooperative, appears stated age and no distress Head: Normocephalic, without obvious abnormality, atraumatic Eyes: conjunctivae/corneas clear. PERRL, EOM's intact. Fundi benign. Ears: normal TM's and external ear canals both ears Nose: Nares normal. Septum midline. Mucosa normal. No drainage or sinus tenderness. Throat: lips, mucosa, and tongue normal; teeth and gums normal Neck: no adenopathy, no carotid bruit, no JVD, supple, symmetrical, trachea midline and thyroid not enlarged, symmetric, no tenderness/mass/nodules Back: symmetric, no curvature. ROM normal. No CVA tenderness. Lungs: clear to auscultation bilaterally Breasts: normal appearance, no masses or tenderness Heart: regular rate and rhythm, S1, S2 normal, no murmur, click, rub or gallop Abdomen: soft, non-tender; bowel  sounds normal; no masses,  no organomegaly Pelvic: deferred Extremities: extremities normal, atraumatic, no cyanosis or edema Pulses: 2+ and symmetric Skin: Skin color, texture, turgor normal. No rashes or  lesions Lymph nodes: Cervical, supraclavicular, and axillary nodes normal. Neurologic: Alert and oriented X 3, normal strength and tone. Normal symmetric reflexes. Normal coordination and gait Psych--no depression, no anxiety      Assessment:    Healthy female exam.     Plan:   ghm utd Check labs   See After Visit Summary for Counseling Recommendations

## 2013-07-12 NOTE — Patient Instructions (Addendum)
Preventive Care for Adults, Female A healthy lifestyle and preventive care can promote health and wellness. Preventive health guidelines for women include the following key practices.  A routine yearly physical is a good way to check with your caregiver about your health and preventive screening. It is a chance to share any concerns and updates on your health, and to receive a thorough exam.  Visit your dentist for a routine exam and preventive care every 6 months. Brush your teeth twice a day and floss once a day. Good oral hygiene prevents tooth decay and gum disease.  The frequency of eye exams is based on your age, health, family medical history, use of contact lenses, and other factors. Follow your caregiver's recommendations for frequency of eye exams.  Eat a healthy diet. Foods like vegetables, fruits, whole grains, low-fat dairy products, and lean protein foods contain the nutrients you need without too many calories. Decrease your intake of foods high in solid fats, added sugars, and salt. Eat the right amount of calories for you.Get information about a proper diet from your caregiver, if necessary.  Regular physical exercise is one of the most important things you can do for your health. Most adults should get at least 150 minutes of moderate-intensity exercise (any activity that increases your heart rate and causes you to sweat) each week. In addition, most adults need muscle-strengthening exercises on 2 or more days a week.  Maintain a healthy weight. The body mass index (BMI) is a screening tool to identify possible weight problems. It provides an estimate of body fat based on height and weight. Your caregiver can help determine your BMI, and can help you achieve or maintain a healthy weight.For adults 20 years and older:  A BMI below 18.5 is considered underweight.  A BMI of 18.5 to 24.9 is normal.  A BMI of 25 to 29.9 is considered overweight.  A BMI of 30 and above is  considered obese.  Maintain normal blood lipids and cholesterol levels by exercising and minimizing your intake of saturated fat. Eat a balanced diet with plenty of fruit and vegetables. Blood tests for lipids and cholesterol should begin at age 20 and be repeated every 5 years. If your lipid or cholesterol levels are high, you are over 50, or you are at high risk for heart disease, you may need your cholesterol levels checked more frequently.Ongoing high lipid and cholesterol levels should be treated with medicines if diet and exercise are not effective.  If you smoke, find out from your caregiver how to quit. If you do not use tobacco, do not start.  If you are pregnant, do not drink alcohol. If you are breastfeeding, be very cautious about drinking alcohol. If you are not pregnant and choose to drink alcohol, do not exceed 1 drink per day. One drink is considered to be 12 ounces (355 mL) of beer, 5 ounces (148 mL) of wine, or 1.5 ounces (44 mL) of liquor.  Avoid use of street drugs. Do not share needles with anyone. Ask for help if you need support or instructions about stopping the use of drugs.  High blood pressure causes heart disease and increases the risk of stroke. Your blood pressure should be checked at least every 1 to 2 years. Ongoing high blood pressure should be treated with medicines if weight loss and exercise are not effective.  If you are 55 to 39 years old, ask your caregiver if you should take aspirin to prevent strokes.  Diabetes   screening involves taking a blood sample to check your fasting blood sugar level. This should be done once every 3 years, after age 45, if you are within normal weight and without risk factors for diabetes. Testing should be considered at a younger age or be carried out more frequently if you are overweight and have at least 1 risk factor for diabetes.  Breast cancer screening is essential preventive care for women. You should practice "breast  self-awareness." This means understanding the normal appearance and feel of your breasts and may include breast self-examination. Any changes detected, no matter how small, should be reported to a caregiver. Women in their 20s and 30s should have a clinical breast exam (CBE) by a caregiver as part of a regular health exam every 1 to 3 years. After age 40, women should have a CBE every year. Starting at age 40, women should consider having a mammography (breast X-ray test) every year. Women who have a family history of breast cancer should talk to their caregiver about genetic screening. Women at a high risk of breast cancer should talk to their caregivers about having magnetic resonance imaging (MRI) and a mammography every year.  The Pap test is a screening test for cervical cancer. A Pap test can show cell changes on the cervix that might become cervical cancer if left untreated. A Pap test is a procedure in which cells are obtained and examined from the lower end of the uterus (cervix).  Women should have a Pap test starting at age 21.  Between ages 21 and 29, Pap tests should be repeated every 2 years.  Beginning at age 30, you should have a Pap test every 3 years as long as the past 3 Pap tests have been normal.  Some women have medical problems that increase the chance of getting cervical cancer. Talk to your caregiver about these problems. It is especially important to talk to your caregiver if a new problem develops soon after your last Pap test. In these cases, your caregiver may recommend more frequent screening and Pap tests.  The above recommendations are the same for women who have or have not gotten the vaccine for human papillomavirus (HPV).  If you had a hysterectomy for a problem that was not cancer or a condition that could lead to cancer, then you no longer need Pap tests. Even if you no longer need a Pap test, a regular exam is a good idea to make sure no other problems are  starting.  If you are between ages 65 and 70, and you have had normal Pap tests going back 10 years, you no longer need Pap tests. Even if you no longer need a Pap test, a regular exam is a good idea to make sure no other problems are starting.  If you have had past treatment for cervical cancer or a condition that could lead to cancer, you need Pap tests and screening for cancer for at least 20 years after your treatment.  If Pap tests have been discontinued, risk factors (such as a new sexual partner) need to be reassessed to determine if screening should be resumed.  The HPV test is an additional test that may be used for cervical cancer screening. The HPV test looks for the virus that can cause the cell changes on the cervix. The cells collected during the Pap test can be tested for HPV. The HPV test could be used to screen women aged 30 years and older, and should   be used in women of any age who have unclear Pap test results. After the age of 30, women should have HPV testing at the same frequency as a Pap test.  Colorectal cancer can be detected and often prevented. Most routine colorectal cancer screening begins at the age of 50 and continues through age 75. However, your caregiver may recommend screening at an earlier age if you have risk factors for colon cancer. On a yearly basis, your caregiver may provide home test kits to check for hidden blood in the stool. Use of a small camera at the end of a tube, to directly examine the colon (sigmoidoscopy or colonoscopy), can detect the earliest forms of colorectal cancer. Talk to your caregiver about this at age 50, when routine screening begins. Direct examination of the colon should be repeated every 5 to 10 years through age 75, unless early forms of pre-cancerous polyps or small growths are found.  Hepatitis C blood testing is recommended for all people born from 1945 through 1965 and any individual with known risks for hepatitis C.  Practice  safe sex. Use condoms and avoid high-risk sexual practices to reduce the spread of sexually transmitted infections (STIs). STIs include gonorrhea, chlamydia, syphilis, trichomonas, herpes, HPV, and human immunodeficiency virus (HIV). Herpes, HIV, and HPV are viral illnesses that have no cure. They can result in disability, cancer, and death. Sexually active women aged 25 and younger should be checked for chlamydia. Older women with new or multiple partners should also be tested for chlamydia. Testing for other STIs is recommended if you are sexually active and at increased risk.  Osteoporosis is a disease in which the bones lose minerals and strength with aging. This can result in serious bone fractures. The risk of osteoporosis can be identified using a bone density scan. Women ages 65 and over and women at risk for fractures or osteoporosis should discuss screening with their caregivers. Ask your caregiver whether you should take a calcium supplement or vitamin D to reduce the rate of osteoporosis.  Menopause can be associated with physical symptoms and risks. Hormone replacement therapy is available to decrease symptoms and risks. You should talk to your caregiver about whether hormone replacement therapy is right for you.  Use sunscreen with sun protection factor (SPF) of 30 or more. Apply sunscreen liberally and repeatedly throughout the day. You should seek shade when your shadow is shorter than you. Protect yourself by wearing long sleeves, pants, a wide-brimmed hat, and sunglasses year round, whenever you are outdoors.  Once a month, do a whole body skin exam, using a mirror to look at the skin on your back. Notify your caregiver of new moles, moles that have irregular borders, moles that are larger than a pencil eraser, or moles that have changed in shape or color.  Stay current with required immunizations.  Influenza. You need a dose every fall (or winter). The composition of the flu vaccine  changes each year, so being vaccinated once is not enough.  Pneumococcal polysaccharide. You need 1 to 2 doses if you smoke cigarettes or if you have certain chronic medical conditions. You need 1 dose at age 65 (or older) if you have never been vaccinated.  Tetanus, diphtheria, pertussis (Tdap, Td). Get 1 dose of Tdap vaccine if you are younger than age 65, are over 65 and have contact with an infant, are a healthcare worker, are pregnant, or simply want to be protected from whooping cough. After that, you need a Td   booster dose every 10 years. Consult your caregiver if you have not had at least 3 tetanus and diphtheria-containing shots sometime in your life or have a deep or dirty wound.  HPV. You need this vaccine if you are a woman age 26 or younger. The vaccine is given in 3 doses over 6 months.  Measles, mumps, rubella (MMR). You need at least 1 dose of MMR if you were born in 1957 or later. You may also need a second dose.  Meningococcal. If you are age 19 to 21 and a first-year college student living in a residence hall, or have one of several medical conditions, you need to get vaccinated against meningococcal disease. You may also need additional booster doses.  Zoster (shingles). If you are age 60 or older, you should get this vaccine.  Varicella (chickenpox). If you have never had chickenpox or you were vaccinated but received only 1 dose, talk to your caregiver to find out if you need this vaccine.  Hepatitis A. You need this vaccine if you have a specific risk factor for hepatitis A virus infection or you simply wish to be protected from this disease. The vaccine is usually given as 2 doses, 6 to 18 months apart.  Hepatitis B. You need this vaccine if you have a specific risk factor for hepatitis B virus infection or you simply wish to be protected from this disease. The vaccine is given in 3 doses, usually over 6 months. Preventive Services / Frequency Ages 19 to 39  Blood  pressure check.** / Every 1 to 2 years.  Lipid and cholesterol check.** / Every 5 years beginning at age 20.  Clinical breast exam.** / Every 3 years for women in their 20s and 30s.  Pap test.** / Every 2 years from ages 21 through 29. Every 3 years starting at age 30 through age 65 or 70 with a history of 3 consecutive normal Pap tests.  HPV screening.** / Every 3 years from ages 30 through ages 65 to 70 with a history of 3 consecutive normal Pap tests.  Hepatitis C blood test.** / For any individual with known risks for hepatitis C.  Skin self-exam. / Monthly.  Influenza immunization.** / Every year.  Pneumococcal polysaccharide immunization.** / 1 to 2 doses if you smoke cigarettes or if you have certain chronic medical conditions.  Tetanus, diphtheria, pertussis (Tdap, Td) immunization. / A one-time dose of Tdap vaccine. After that, you need a Td booster dose every 10 years.  HPV immunization. / 3 doses over 6 months, if you are 26 and younger.  Measles, mumps, rubella (MMR) immunization. / You need at least 1 dose of MMR if you were born in 1957 or later. You may also need a second dose.  Meningococcal immunization. / 1 dose if you are age 19 to 21 and a first-year college student living in a residence hall, or have one of several medical conditions, you need to get vaccinated against meningococcal disease. You may also need additional booster doses.  Varicella immunization.** / Consult your caregiver.  Hepatitis A immunization.** / Consult your caregiver. 2 doses, 6 to 18 months apart.  Hepatitis B immunization.** / Consult your caregiver. 3 doses usually over 6 months. Ages 40 to 64  Blood pressure check.** / Every 1 to 2 years.  Lipid and cholesterol check.** / Every 5 years beginning at age 20.  Clinical breast exam.** / Every year after age 40.  Mammogram.** / Every year beginning at age 40   and continuing for as long as you are in good health. Consult with your  caregiver.  Pap test.** / Every 3 years starting at age 30 through age 65 or 70 with a history of 3 consecutive normal Pap tests.  HPV screening.** / Every 3 years from ages 30 through ages 65 to 70 with a history of 3 consecutive normal Pap tests.  Fecal occult blood test (FOBT) of stool. / Every year beginning at age 50 and continuing until age 75. You may not need to do this test if you get a colonoscopy every 10 years.  Flexible sigmoidoscopy or colonoscopy.** / Every 5 years for a flexible sigmoidoscopy or every 10 years for a colonoscopy beginning at age 50 and continuing until age 75.  Hepatitis C blood test.** / For all people born from 1945 through 1965 and any individual with known risks for hepatitis C.  Skin self-exam. / Monthly.  Influenza immunization.** / Every year.  Pneumococcal polysaccharide immunization.** / 1 to 2 doses if you smoke cigarettes or if you have certain chronic medical conditions.  Tetanus, diphtheria, pertussis (Tdap, Td) immunization.** / A one-time dose of Tdap vaccine. After that, you need a Td booster dose every 10 years.  Measles, mumps, rubella (MMR) immunization. / You need at least 1 dose of MMR if you were born in 1957 or later. You may also need a second dose.  Varicella immunization.** / Consult your caregiver.  Meningococcal immunization.** / Consult your caregiver.  Hepatitis A immunization.** / Consult your caregiver. 2 doses, 6 to 18 months apart.  Hepatitis B immunization.** / Consult your caregiver. 3 doses, usually over 6 months. Ages 65 and over  Blood pressure check.** / Every 1 to 2 years.  Lipid and cholesterol check.** / Every 5 years beginning at age 20.  Clinical breast exam.** / Every year after age 40.  Mammogram.** / Every year beginning at age 40 and continuing for as long as you are in good health. Consult with your caregiver.  Pap test.** / Every 3 years starting at age 30 through age 65 or 70 with a 3  consecutive normal Pap tests. Testing can be stopped between 65 and 70 with 3 consecutive normal Pap tests and no abnormal Pap or HPV tests in the past 10 years.  HPV screening.** / Every 3 years from ages 30 through ages 65 or 70 with a history of 3 consecutive normal Pap tests. Testing can be stopped between 65 and 70 with 3 consecutive normal Pap tests and no abnormal Pap or HPV tests in the past 10 years.  Fecal occult blood test (FOBT) of stool. / Every year beginning at age 50 and continuing until age 75. You may not need to do this test if you get a colonoscopy every 10 years.  Flexible sigmoidoscopy or colonoscopy.** / Every 5 years for a flexible sigmoidoscopy or every 10 years for a colonoscopy beginning at age 50 and continuing until age 75.  Hepatitis C blood test.** / For all people born from 1945 through 1965 and any individual with known risks for hepatitis C.  Osteoporosis screening.** / A one-time screening for women ages 65 and over and women at risk for fractures or osteoporosis.  Skin self-exam. / Monthly.  Influenza immunization.** / Every year.  Pneumococcal polysaccharide immunization.** / 1 dose at age 65 (or older) if you have never been vaccinated.  Tetanus, diphtheria, pertussis (Tdap, Td) immunization. / A one-time dose of Tdap vaccine if you are over   65 and have contact with an infant, are a healthcare worker, or simply want to be protected from whooping cough. After that, you need a Td booster dose every 10 years.  Varicella immunization.** / Consult your caregiver.  Meningococcal immunization.** / Consult your caregiver.  Hepatitis A immunization.** / Consult your caregiver. 2 doses, 6 to 18 months apart.  Hepatitis B immunization.** / Check with your caregiver. 3 doses, usually over 6 months. ** Family history and personal history of risk and conditions may change your caregiver's recommendations. Document Released: 12/07/2001 Document Revised: 01/03/2012  Document Reviewed: 03/08/2011 [employer] Patient Information 2014 [employer].  

## 2013-07-13 ENCOUNTER — Other Ambulatory Visit (INDEPENDENT_AMBULATORY_CARE_PROVIDER_SITE_OTHER): Payer: No Typology Code available for payment source

## 2013-07-13 DIAGNOSIS — Z0000 Encounter for general adult medical examination without abnormal findings: Secondary | ICD-10-CM

## 2013-07-13 LAB — BASIC METABOLIC PANEL
GFR: 89.92 mL/min (ref >60.00)
Glucose, Bld: 87 mg/dL (ref 70–99)
Sodium: 139 mEq/L (ref 135–145)

## 2013-07-13 LAB — CBC WITH DIFFERENTIAL/PLATELET
Eosinophils Relative: 1.8 % (ref 0.0–5.0)
HCT: 42.2 % (ref 36.0–46.0)
Hemoglobin: 14.6 g/dL (ref 12.0–15.0)
Lymphocytes Relative: 18.6 % (ref 12.0–46.0)
Lymphs Abs: 1.3 10*3/uL (ref 0.7–4.0)
MCHC: 34.5 g/dL (ref 30.0–36.0)
MCV: 91.8 fl (ref 78.0–100.0)
Monocytes Relative: 4.6 % (ref 3.0–12.0)
Neutrophils Relative %: 74.4 % (ref 43.0–77.0)
RDW: 12.8 % (ref 11.5–14.6)

## 2013-07-13 LAB — POCT URINALYSIS DIPSTICK
Blood, UA: NEGATIVE
Nitrite, UA: NEGATIVE
Protein, UA: NEGATIVE
Urobilinogen, UA: 0.2
pH, UA: 6

## 2013-07-13 LAB — LIPID PANEL
HDL: 67.2 mg/dL (ref >39.00)
LDL Cholesterol: 75 mg/dL (ref 0–99)
Total CHOL/HDL Ratio: 2
Triglycerides: 77 mg/dL (ref 0.0–149.0)

## 2013-07-13 LAB — HEPATIC FUNCTION PANEL
Bilirubin, Direct: 0 mg/dL (ref 0.0–0.3)
Total Protein: 6.6 g/dL (ref 6.0–8.3)

## 2013-07-14 LAB — HIV ANTIBODY (ROUTINE TESTING W REFLEX): HIV: NONREACTIVE

## 2013-07-15 NOTE — Assessment & Plan Note (Signed)
con't valtrex  

## 2013-07-15 NOTE — Assessment & Plan Note (Signed)
Refill meds

## 2013-07-16 ENCOUNTER — Telehealth: Payer: Self-pay | Admitting: General Practice

## 2013-07-16 NOTE — Telephone Encounter (Signed)
PA for paxil faxed to [phone] on 07/16/2013.

## 2013-07-19 ENCOUNTER — Other Ambulatory Visit: Payer: Self-pay | Admitting: General Practice

## 2013-07-19 MED ORDER — PAROXETINE HCL 10 MG PO TABS: 10.0000 mg | ORAL_TABLET | ORAL | Status: DC

## 2013-07-19 NOTE — Telephone Encounter (Signed)
Spoke with [facility] again. They stated pt only has to try the immediate release and fail. Before extended release can be prescribed. Paroxetine 10mg  sent to pharmacy and pt notified.

## 2013-07-19 NOTE — Telephone Encounter (Signed)
Those are antipsychotic meds---- she is not psychotic she is anxous.

## 2013-07-19 NOTE — Telephone Encounter (Signed)
Spoke with [employer]. Paroxetine will not be covered. Pt has to first try and fail two of the following medications:   risperdal xyprexa geodone seroquel   Please advise which medication you would like pt to be on.

## 2013-07-19 NOTE — Telephone Encounter (Signed)
Those are all antipsychotic medications

## 2014-01-22 ENCOUNTER — Ambulatory Visit (INDEPENDENT_AMBULATORY_CARE_PROVIDER_SITE_OTHER): Payer: No Typology Code available for payment source | Admitting: Internal Medicine

## 2014-01-22 ENCOUNTER — Telehealth: Payer: Self-pay | Admitting: Family Medicine

## 2014-01-22 ENCOUNTER — Encounter: Payer: Self-pay | Admitting: Internal Medicine

## 2014-01-22 VITALS — BP 88/50 | HR 69 | Temp 98.1°F | Wt 108.0 lb

## 2014-01-22 DIAGNOSIS — R1030 Lower abdominal pain, unspecified: Secondary | ICD-10-CM

## 2014-01-22 DIAGNOSIS — R102 Pelvic and perineal pain: Secondary | ICD-10-CM

## 2014-01-22 DIAGNOSIS — R350 Frequency of micturition: Secondary | ICD-10-CM

## 2014-01-22 DIAGNOSIS — R109 Unspecified abdominal pain: Secondary | ICD-10-CM

## 2014-01-22 LAB — POCT URINALYSIS DIPSTICK
Bilirubin, UA: NEGATIVE
GLUCOSE UA: NEGATIVE
Ketones, UA: NEGATIVE
Leukocytes, UA: NEGATIVE
NITRITE UA: NEGATIVE
PROTEIN UA: NEGATIVE
RBC UA: NEGATIVE
SPEC GRAV UA: 1.01
UROBILINOGEN UA: NEGATIVE
pH, UA: 5

## 2014-01-22 NOTE — Telephone Encounter (Signed)
Relevant patient education assigned to patient using Emmi. ° °

## 2014-01-22 NOTE — Addendum Note (Signed)
Addended by: Kathlene November E on: 01/22/2014 06:01 PM   Modules accepted: Level of Service

## 2014-01-22 NOTE — Patient Instructions (Signed)
Please see your gynecologist Call if symptoms severe, fever, chills

## 2014-01-22 NOTE — Progress Notes (Signed)
Pre visit review using our clinic review tool, if applicable. No additional management support is needed unless otherwise documented below in the visit note. 

## 2014-01-22 NOTE — Progress Notes (Signed)
   Subjective:    Patient ID: Mckenzie Harris, female    DOB: [birth year], 38 y.o.   MRN: [id number]  DOS:  01/22/2014 Type of  visit: Acute visit  ~ 2 weeks h/o lower abd discomfort and lower back discomfort, sx decrease w/ urination. Denies dysuria per se , some urinary frequency , sometimes w/ little urine production.   ROS Denies fever or chills Denies nausea, vomiting, diarrhea. No vaginal discharge, abnormal vaginal bleeding or genital rash. Last menstrual period a month ago.   Past Medical History  Diagnosis Date  . Anxiety   . HSV-2 infection     History reviewed. No pertinent past surgical history.  History   Social History  . Marital Status: Married    Spouse Name: N/A    Number of Children: N/A  . Years of Education: N/A   Occupational History  . Preschool Teacher    Social History Main Topics  . Smoking status: Current Every Day Smoker -- 1.00 packs/day for 20 years  . Smokeless tobacco: Not on file  . Alcohol Use: Yes  . Drug Use: No  . Sexual Activity: Not Currently   Other Topics Concern  . Not on file   Social History Narrative   Regular exercise: no   Caffeine use: diet coke daily        Medication List       This list is accurate as of: 01/22/14  6:00 PM.  Always use your most recent med list.               PARoxetine 10 MG tablet  Commonly known as:  PAXIL  Take 1 tablet (10 mg total) by mouth every morning.     valACYclovir 1000 MG tablet  Commonly known as:  VALTREX  Take 1 tablet (1,000 mg total) by mouth daily.           Objective:   Physical Exam BP 88/50  Pulse 69  Temp(Src) 98.1 F (36.7 C)  Wt 108 lb (48.988 kg)  SpO2 100% General -- alert, well-developed, NAD.  Abdomen-- Not distended, good bowel sounds,soft, non-tender. No rebound or rigidity. No mass,organomegaly. No CVA tenderness  Extremities-- no pretibial edema bilaterally  Neurologic--  alert & oriented X3. Speech normal, gait normal, strength normal  in all extremities.  Psych-- Cognition and judgment appear intact. Cooperative with normal attention span and concentration. No anxious or depressed appearing.      Assessment & Plan:   Pelvic discomfort, Urinalysis is negative, urine culture pending Symptoms are not consistent with PID but recommend a  pelvic exam, patient will see her gynecologist I also recommended a pregnancy test, patient declined, states she's not sexually active. Other considerations are ovarian cyst.

## 2014-01-24 ENCOUNTER — Encounter: Payer: Self-pay | Admitting: *Deleted

## 2014-01-24 LAB — URINE CULTURE

## 2014-06-14 ENCOUNTER — Other Ambulatory Visit: Payer: Self-pay | Admitting: Obstetrics and Gynecology

## 2014-06-14 DIAGNOSIS — R928 Other abnormal and inconclusive findings on diagnostic imaging of breast: Secondary | ICD-10-CM

## 2014-06-27 ENCOUNTER — Ambulatory Visit
Admission: RE | Admit: 2014-06-27 | Discharge: 2014-06-27 | Disposition: A | Discharge status: Home / Self Care | Payer: No Typology Code available for payment source | Source: Ambulatory Visit | Attending: Obstetrics and Gynecology | Admitting: Obstetrics and Gynecology

## 2014-06-27 ENCOUNTER — Encounter (INDEPENDENT_AMBULATORY_CARE_PROVIDER_SITE_OTHER): Payer: Self-pay

## 2014-06-27 DIAGNOSIS — R928 Other abnormal and inconclusive findings on diagnostic imaging of breast: Secondary | ICD-10-CM

## 2014-10-21 ENCOUNTER — Telehealth: Payer: Self-pay | Admitting: Family Medicine

## 2014-10-21 NOTE — Telephone Encounter (Signed)
No refill --- pt would need ov

## 2014-10-21 NOTE — Telephone Encounter (Signed)
Pt requesting a rx for ALPRAZolam (XANAX) 0.5 MG tablet due to anxiety while flying. Pt states her flight leaves Wednesday.

## 2014-10-21 NOTE — Telephone Encounter (Signed)
Rx never written by PCP, the patient was last seen 07/12/2013. Please advise     KP

## 2014-10-23 NOTE — Telephone Encounter (Signed)
lvm advising pt of MD instructions  °

## 2015-11-12 ENCOUNTER — Ambulatory Visit (INDEPENDENT_AMBULATORY_CARE_PROVIDER_SITE_OTHER): Payer: BLUE CROSS/BLUE SHIELD | Admitting: Medical

## 2015-11-12 ENCOUNTER — Encounter: Payer: Self-pay | Admitting: Medical

## 2015-11-12 VITALS — BP 98/70 | HR 75 | Temp 97.6°F | Ht 65.0 in | Wt 116.2 lb

## 2015-11-12 DIAGNOSIS — Z0000 Encounter for general adult medical examination without abnormal findings: Secondary | ICD-10-CM

## 2015-11-12 MED ORDER — ESOMEPRAZOLE MAGNESIUM 40 MG PO CPDR: 40.0000 mg | DELAYED_RELEASE_CAPSULE | Freq: Every day | ORAL | Status: DC

## 2015-11-12 MED ORDER — ALPRAZOLAM 0.25 MG PO TABS: 0.2500 mg | ORAL_TABLET | Freq: Every evening | ORAL | Status: DC | PRN

## 2015-11-12 MED ORDER — MOMETASONE FUROATE 50 MCG/ACT NA SUSP: NASAL | Status: DC

## 2015-11-12 NOTE — Patient Instructions (Addendum)
Wellness examination Cbc, cmp, tsh, lipid panel and ua. I am putting labs at future to be done fasting. Please get done in next 7-10 days.    You do describe some heartburn type symptoms and some likely recent allergy symptom as well. I want you to eat a healthy diet and try nexium otc. You can use 2 of the nexium otc tabs.  For the probalble allergy symptoms I will prescribe nasonex nasal spray.  If you get severe sinus pressure again like last week would consider antibiotic.  For intermittent anxiety or insomnia gave presrciption of xanax.  Follow up in 2-3 weeks or as needed  Preventive Care for Adults, Female A healthy lifestyle and preventive care can promote health and wellness. Preventive health guidelines for women include the following key practices.  A routine yearly physical is a good way to check with your health care provider about your health and preventive screening. It is a chance to share any concerns and updates on your health and to receive a thorough exam.  Visit your dentist for a routine exam and preventive care every 6 months. Brush your teeth twice a day and floss once a day. Good oral hygiene prevents tooth decay and gum disease.  The frequency of eye exams is based on your age, health, family medical history, use of contact lenses, and other factors. Follow your health care provider's recommendations for frequency of eye exams.  Eat a healthy diet. Foods like vegetables, fruits, whole grains, low-fat dairy products, and lean protein foods contain the nutrients you need without too many calories. Decrease your intake of foods high in solid fats, added sugars, and salt. Eat the right amount of calories for you.Get information about a proper diet from your health care provider, if necessary.  Regular physical exercise is one of the most important things you can do for your health. Most adults should get at least 150 minutes of moderate-intensity exercise (any activity  that increases your heart rate and causes you to sweat) each week. In addition, most adults need muscle-strengthening exercises on 2 or more days a week.  Maintain a healthy weight. The body mass index (BMI) is a screening tool to identify possible weight problems. It provides an estimate of body fat based on height and weight. Your health care provider can find your BMI and can help you achieve or maintain a healthy weight.For adults 20 years and older:  A BMI below 18.5 is considered underweight.  A BMI of 18.5 to 24.9 is normal.  A BMI of 25 to 29.9 is considered overweight.  A BMI of 30 and above is considered obese.  Maintain normal blood lipids and cholesterol levels by exercising and minimizing your intake of saturated fat. Eat a balanced diet with plenty of fruit and vegetables. Blood tests for lipids and cholesterol should begin at age 45 and be repeated every 5 years. If your lipid or cholesterol levels are high, you are over 50, or you are at high risk for heart disease, you may need your cholesterol levels checked more frequently.Ongoing high lipid and cholesterol levels should be treated with medicines if diet and exercise are not working.  If you smoke, find out from your health care provider how to quit. If you do not use tobacco, do not start.  Lung cancer screening is recommended for adults aged 1-80 years who are at high risk for developing lung cancer because of a history of smoking. A yearly low-dose CT scan of the  lungs is recommended for people who have at least a 30-pack-year history of smoking and are a current smoker or have quit within the past 15 years. A pack year of smoking is smoking an average of 1 pack of cigarettes a day for 1 year (for example: 1 pack a day for 30 years or 2 packs a day for 15 years). Yearly screening should continue until the smoker has stopped smoking for at least 15 years. Yearly screening should be stopped for people who develop a health  problem that would prevent them from having lung cancer treatment.  If you are pregnant, do not drink alcohol. If you are breastfeeding, be very cautious about drinking alcohol. If you are not pregnant and choose to drink alcohol, do not have more than 1 drink per day. One drink is considered to be 12 ounces (355 mL) of beer, 5 ounces (148 mL) of wine, or 1.5 ounces (44 mL) of liquor.  Avoid use of street drugs. Do not share needles with anyone. Ask for help if you need support or instructions about stopping the use of drugs.  High blood pressure causes heart disease and increases the risk of stroke. Your blood pressure should be checked at least every 1 to 2 years. Ongoing high blood pressure should be treated with medicines if weight loss and exercise do not work.  If you are 34-14 years old, ask your health care provider if you should take aspirin to prevent strokes.  Diabetes screening is done by taking a blood sample to check your blood glucose level after you have not eaten for a certain period of time (fasting). If you are not overweight and you do not have risk factors for diabetes, you should be screened once every 3 years starting at age 99. If you are overweight or obese and you are 39-13 years of age, you should be screened for diabetes every year as part of your cardiovascular risk assessment.  Breast cancer screening is essential preventive care for women. You should practice "breast self-awareness." This means understanding the normal appearance and feel of your breasts and may include breast self-examination. Any changes detected, no matter how small, should be reported to a health care provider. Women in their 16s and 30s should have a clinical breast exam (CBE) by a health care provider as part of a regular health exam every 1 to 3 years. After age 82, women should have a CBE every year. Starting at age 58, women should consider having a mammogram (breast X-ray test) every year. Women  who have a family history of breast cancer should talk to their health care provider about genetic screening. Women at a high risk of breast cancer should talk to their health care providers about having an MRI and a mammogram every year.  Breast cancer gene (BRCA)-related cancer risk assessment is recommended for women who have family members with BRCA-related cancers. BRCA-related cancers include breast, ovarian, tubal, and peritoneal cancers. Having family members with these cancers may be associated with an increased risk for harmful changes (mutations) in the breast cancer genes BRCA1 and BRCA2. Results of the assessment will determine the need for genetic counseling and BRCA1 and BRCA2 testing.  Your health care provider may recommend that you be screened regularly for cancer of the pelvic organs (ovaries, uterus, and vagina). This screening involves a pelvic examination, including checking for microscopic changes to the surface of your cervix (Pap test). You may be encouraged to have this screening done every  3 years, beginning at age 2.  For women ages 95-65, health care providers may recommend pelvic exams and Pap testing every 3 years, or they may recommend the Pap and pelvic exam, combined with testing for human papilloma virus (HPV), every 5 years. Some types of HPV increase your risk of cervical cancer. Testing for HPV may also be done on women of any age with unclear Pap test results.  Other health care providers may not recommend any screening for nonpregnant women who are considered low risk for pelvic cancer and who do not have symptoms. Ask your health care provider if a screening pelvic exam is right for you.  If you have had past treatment for cervical cancer or a condition that could lead to cancer, you need Pap tests and screening for cancer for at least 20 years after your treatment. If Pap tests have been discontinued, your risk factors (such as having a new sexual partner) need to  be reassessed to determine if screening should resume. Some women have medical problems that increase the chance of getting cervical cancer. In these cases, your health care provider may recommend more frequent screening and Pap tests.  Colorectal cancer can be detected and often prevented. Most routine colorectal cancer screening begins at the age of 55 years and continues through age 90 years. However, your health care provider may recommend screening at an earlier age if you have risk factors for colon cancer. On a yearly basis, your health care provider may provide home test kits to check for hidden blood in the stool. Use of a small camera at the end of a tube, to directly examine the colon (sigmoidoscopy or colonoscopy), can detect the earliest forms of colorectal cancer. Talk to your health care provider about this at age 7, when routine screening begins. Direct exam of the colon should be repeated every 5-10 years through age 40 years, unless early forms of precancerous polyps or small growths are found.  People who are at an increased risk for hepatitis B should be screened for this virus. You are considered at high risk for hepatitis B if:  You were born in a country where hepatitis B occurs often. Talk with your health care provider about which countries are considered high risk.  Your parents were born in a high-risk country and you have not received a shot to protect against hepatitis B (hepatitis B vaccine).  You have HIV or AIDS.  You use needles to inject street drugs.  You live with, or have sex with, someone who has hepatitis B.  You get hemodialysis treatment.  You take certain medicines for conditions like cancer, organ transplantation, and autoimmune conditions.  Hepatitis C blood testing is recommended for all people born from [birth year] through 1967 and any individual with known risks for hepatitis C.  Practice safe sex. Use condoms and avoid high-risk sexual practices to  reduce the spread of sexually transmitted infections (STIs). STIs include gonorrhea, chlamydia, syphilis, trichomonas, herpes, HPV, and human immunodeficiency virus (HIV). Herpes, HIV, and HPV are viral illnesses that have no cure. They can result in disability, cancer, and death.  You should be screened for sexually transmitted illnesses (STIs) including gonorrhea and chlamydia if:  You are sexually active and are younger than 24 years.  You are older than 24 years and your health care provider tells you that you are at risk for this type of infection.  Your sexual activity has changed since you were last screened and you are  at an increased risk for chlamydia or gonorrhea. Ask your health care provider if you are at risk.  If you are at risk of being infected with HIV, it is recommended that you take a prescription medicine daily to prevent HIV infection. This is called preexposure prophylaxis (PrEP). You are considered at risk if:  You are sexually active and do not regularly use condoms or know the HIV status of your partner(s).  You take drugs by injection.  You are sexually active with a partner who has HIV.  Talk with your health care provider about whether you are at high risk of being infected with HIV. If you choose to begin PrEP, you should first be tested for HIV. You should then be tested every 3 months for as long as you are taking PrEP.  Osteoporosis is a disease in which the bones lose minerals and strength with aging. This can result in serious bone fractures or breaks. The risk of osteoporosis can be identified using a bone density scan. Women ages 44 years and over and women at risk for fractures or osteoporosis should discuss screening with their health care providers. Ask your health care provider whether you should take a calcium supplement or vitamin D to reduce the rate of osteoporosis.  Menopause can be associated with physical symptoms and risks. Hormone replacement  therapy is available to decrease symptoms and risks. You should talk to your health care provider about whether hormone replacement therapy is right for you.  Use sunscreen. Apply sunscreen liberally and repeatedly throughout the day. You should seek shade when your shadow is shorter than you. Protect yourself by wearing long sleeves, pants, a wide-brimmed hat, and sunglasses year round, whenever you are outdoors.  Once a month, do a whole body skin exam, using a mirror to look at the skin on your back. Tell your health care provider of new moles, moles that have irregular borders, moles that are larger than a pencil eraser, or moles that have changed in shape or color.  Stay current with required vaccines (immunizations).  Influenza vaccine. All adults should be immunized every year.  Tetanus, diphtheria, and acellular pertussis (Td, Tdap) vaccine. Pregnant women should receive 1 dose of Tdap vaccine during each pregnancy. The dose should be obtained regardless of the length of time since the last dose. Immunization is preferred during the 27th-36th week of gestation. An adult who has not previously received Tdap or who does not know her vaccine status should receive 1 dose of Tdap. This initial dose should be followed by tetanus and diphtheria toxoids (Td) booster doses every 10 years. Adults with an unknown or incomplete history of completing a 3-dose immunization series with Td-containing vaccines should begin or complete a primary immunization series including a Tdap dose. Adults should receive a Td booster every 10 years.  Varicella vaccine. An adult without evidence of immunity to varicella should receive 2 doses or a second dose if she has previously received 1 dose. Pregnant females who do not have evidence of immunity should receive the first dose after pregnancy. This first dose should be obtained before leaving the health care facility. The second dose should be obtained 4-8 weeks after the  first dose.  Human papillomavirus (HPV) vaccine. Females aged 13-26 years who have not received the vaccine previously should obtain the 3-dose series. The vaccine is not recommended for use in pregnant females. However, pregnancy testing is not needed before receiving a dose. If a female is found to be  pregnant after receiving a dose, no treatment is needed. In that case, the remaining doses should be delayed until after the pregnancy. Immunization is recommended for any person with an immunocompromised condition through the age of 76 years if she did not get any or all doses earlier. During the 3-dose series, the second dose should be obtained 4-8 weeks after the first dose. The third dose should be obtained 24 weeks after the first dose and 16 weeks after the second dose.  Zoster vaccine. One dose is recommended for adults aged 25 years or older unless certain conditions are present.  Measles, mumps, and rubella (MMR) vaccine. Adults born before [birth year] generally are considered immune to measles and mumps. Adults born in [birth year] or later should have 1 or more doses of MMR vaccine unless there is a contraindication to the vaccine or there is laboratory evidence of immunity to each of the three diseases. A routine second dose of MMR vaccine should be obtained at least 28 days after the first dose for students attending postsecondary schools, health care workers, or international travelers. People who received inactivated measles vaccine or an unknown type of measles vaccine during 1963-1967 should receive 2 doses of MMR vaccine. People who received inactivated mumps vaccine or an unknown type of mumps vaccine before 1979 and are at high risk for mumps infection should consider immunization with 2 doses of MMR vaccine. For females of childbearing age, rubella immunity should be determined. If there is no evidence of immunity, females who are not pregnant should be vaccinated. If there is no evidence of immunity,  females who are pregnant should delay immunization until after pregnancy. Unvaccinated health care workers born before [birth year] who lack laboratory evidence of measles, mumps, or rubella immunity or laboratory confirmation of disease should consider measles and mumps immunization with 2 doses of MMR vaccine or rubella immunization with 1 dose of MMR vaccine.  Pneumococcal 13-valent conjugate (PCV13) vaccine. When indicated, a person who is uncertain of his immunization history and has no record of immunization should receive the PCV13 vaccine. All adults 44 years of age and older should receive this vaccine. An adult aged 42 years or older who has certain medical conditions and has not been previously immunized should receive 1 dose of PCV13 vaccine. This PCV13 should be followed with a dose of pneumococcal polysaccharide (PPSV23) vaccine. Adults who are at high risk for pneumococcal disease should obtain the PPSV23 vaccine at least 8 weeks after the dose of PCV13 vaccine. Adults older than 42 years of age who have normal immune system function should obtain the PPSV23 vaccine dose at least 1 year after the dose of PCV13 vaccine.  Pneumococcal polysaccharide (PPSV23) vaccine. When PCV13 is also indicated, PCV13 should be obtained first. All adults aged 59 years and older should be immunized. An adult younger than age 75 years who has certain medical conditions should be immunized. Any person who resides in a nursing home or long-term care facility should be immunized. An adult smoker should be immunized. People with an immunocompromised condition and certain other conditions should receive both PCV13 and PPSV23 vaccines. People with human immunodeficiency virus (HIV) infection should be immunized as soon as possible after diagnosis. Immunization during chemotherapy or radiation therapy should be avoided. Routine use of PPSV23 vaccine is not recommended for American Indians, [place] Natives, or people younger than 65  years unless there are medical conditions that require PPSV23 vaccine. When indicated, people who have unknown immunization and have no record of  immunization should receive PPSV23 vaccine. One-time revaccination 5 years after the first dose of PPSV23 is recommended for people aged 19-64 years who have chronic kidney failure, nephrotic syndrome, asplenia, or immunocompromised conditions. People who received 1-2 doses of PPSV23 before age 54 years should receive another dose of PPSV23 vaccine at age 60 years or later if at least 5 years have passed since the previous dose. Doses of PPSV23 are not needed for people immunized with PPSV23 at or after age 35 years.  Meningococcal vaccine. Adults with asplenia or persistent complement component deficiencies should receive 2 doses of quadrivalent meningococcal conjugate (MenACWY-D) vaccine. The doses should be obtained at least 2 months apart. Microbiologists working with certain meningococcal bacteria, [place] recruits, people at risk during an outbreak, and people who travel to or live in countries with a high rate of meningitis should be immunized. A first-year college student up through age 31 years who is living in a residence hall should receive a dose if she did not receive a dose on or after her 16th birthday. Adults who have certain high-risk conditions should receive one or more doses of vaccine.  Hepatitis A vaccine. Adults who wish to be protected from this disease, have certain high-risk conditions, work with hepatitis A-infected animals, work in hepatitis A research labs, or travel to or work in countries with a high rate of hepatitis A should be immunized. Adults who were previously unvaccinated and who anticipate close contact with an international adoptee during the first 60 days after arrival in the [place] from a country with a high rate of hepatitis A should be immunized.  Hepatitis B vaccine. Adults who wish to be protected from this  disease, have certain high-risk conditions, may be exposed to blood or other infectious body fluids, are household contacts or sex partners of hepatitis B positive people, are clients or workers in certain care facilities, or travel to or work in countries with a high rate of hepatitis B should be immunized.  Haemophilus influenzae type b (Hib) vaccine. A previously unvaccinated person with asplenia or sickle cell disease or having a scheduled splenectomy should receive 1 dose of Hib vaccine. Regardless of previous immunization, a recipient of a hematopoietic stem cell transplant should receive a 3-dose series 6-12 months after her successful transplant. Hib vaccine is not recommended for adults with HIV infection. Preventive Services / Frequency Ages 59 to 54 years  Blood pressure check.** / Every 3-5 years.  Lipid and cholesterol check.** / Every 5 years beginning at age 66.  Clinical breast exam.** / Every 3 years for women in their 55s and 62s.  BRCA-related cancer risk assessment.** / For women who have family members with a BRCA-related cancer (breast, ovarian, tubal, or peritoneal cancers).  Pap test.** / Every 2 years from ages 67 through 52. Every 3 years starting at age 41 through age 20 or 56 with a history of 3 consecutive normal Pap tests.  HPV screening.** / Every 3 years from ages 55 through ages 9 to 51 with a history of 3 consecutive normal Pap tests.  Hepatitis C blood test.** / For any individual with known risks for hepatitis C.  Skin self-exam. / Monthly.  Influenza vaccine. / Every year.  Tetanus, diphtheria, and acellular pertussis (Tdap, Td) vaccine.** / Consult your health care provider. Pregnant women should receive 1 dose of Tdap vaccine during each pregnancy. 1 dose of Td every 10 years.  Varicella vaccine.** / Consult your health care provider. Pregnant females who  do not have evidence of immunity should receive the first dose after pregnancy.  HPV vaccine. /  3 doses over 6 months, if 12 and younger. The vaccine is not recommended for use in pregnant females. However, pregnancy testing is not needed before receiving a dose.  Measles, mumps, rubella (MMR) vaccine.** / You need at least 1 dose of MMR if you were born in 1957 or later. You may also need a 2nd dose. For females of childbearing age, rubella immunity should be determined. If there is no evidence of immunity, females who are not pregnant should be vaccinated. If there is no evidence of immunity, females who are pregnant should delay immunization until after pregnancy.  Pneumococcal 13-valent conjugate (PCV13) vaccine.** / Consult your health care provider.  Pneumococcal polysaccharide (PPSV23) vaccine.** / 1 to 2 doses if you smoke cigarettes or if you have certain conditions.  Meningococcal vaccine.** / 1 dose if you are age 72 to 8 years and a Market researcher living in a residence hall, or have one of several medical conditions, you need to get vaccinated against meningococcal disease. You may also need additional booster doses.  Hepatitis A vaccine.** / Consult your health care provider.  Hepatitis B vaccine.** / Consult your health care provider.  Haemophilus influenzae type b (Hib) vaccine.** / Consult your health care provider. Ages 80 to 36 years  Blood pressure check.** / Every year.  Lipid and cholesterol check.** / Every 5 years beginning at age 33 years.  Lung cancer screening. / Every year if you are aged 35-80 years and have a 30-pack-year history of smoking and currently smoke or have quit within the past 15 years. Yearly screening is stopped once you have quit smoking for at least 15 years or develop a health problem that would prevent you from having lung cancer treatment.  Clinical breast exam.** / Every year after age 12 years.  BRCA-related cancer risk assessment.** / For women who have family members with a BRCA-related cancer (breast, ovarian, tubal, or  peritoneal cancers).  Mammogram.** / Every year beginning at age 37 years and continuing for as long as you are in good health. Consult with your health care provider.  Pap test.** / Every 3 years starting at age 77 years through age 88 or 65 years with a history of 3 consecutive normal Pap tests.  HPV screening.** / Every 3 years from ages 48 years through ages 47 to 51 years with a history of 3 consecutive normal Pap tests.  Fecal occult blood test (FOBT) of stool. / Every year beginning at age 36 years and continuing until age 63 years. You may not need to do this test if you get a colonoscopy every 10 years.  Flexible sigmoidoscopy or colonoscopy.** / Every 5 years for a flexible sigmoidoscopy or every 10 years for a colonoscopy beginning at age 31 years and continuing until age 61 years.  Hepatitis C blood test.** / For all people born from [birth year] through 1967 and any individual with known risks for hepatitis C.  Skin self-exam. / Monthly.  Influenza vaccine. / Every year.  Tetanus, diphtheria, and acellular pertussis (Tdap/Td) vaccine.** / Consult your health care provider. Pregnant women should receive 1 dose of Tdap vaccine during each pregnancy. 1 dose of Td every 10 years.  Varicella vaccine.** / Consult your health care provider. Pregnant females who do not have evidence of immunity should receive the first dose after pregnancy.  Zoster vaccine.** / 1 dose for adults aged 41  years or older.  Measles, mumps, rubella (MMR) vaccine.** / You need at least 1 dose of MMR if you were born in 1957 or later. You may also need a second dose. For females of childbearing age, rubella immunity should be determined. If there is no evidence of immunity, females who are not pregnant should be vaccinated. If there is no evidence of immunity, females who are pregnant should delay immunization until after pregnancy.  Pneumococcal 13-valent conjugate (PCV13) vaccine.** / Consult your health care  provider.  Pneumococcal polysaccharide (PPSV23) vaccine.** / 1 to 2 doses if you smoke cigarettes or if you have certain conditions.  Meningococcal vaccine.** / Consult your health care provider.  Hepatitis A vaccine.** / Consult your health care provider.  Hepatitis B vaccine.** / Consult your health care provider.  Haemophilus influenzae type b (Hib) vaccine.** / Consult your health care provider. Ages 89 years and over  Blood pressure check.** / Every year.  Lipid and cholesterol check.** / Every 5 years beginning at age 7 years.  Lung cancer screening. / Every year if you are aged 6-80 years and have a 30-pack-year history of smoking and currently smoke or have quit within the past 15 years. Yearly screening is stopped once you have quit smoking for at least 15 years or develop a health problem that would prevent you from having lung cancer treatment.  Clinical breast exam.** / Every year after age 82 years.  BRCA-related cancer risk assessment.** / For women who have family members with a BRCA-related cancer (breast, ovarian, tubal, or peritoneal cancers).  Mammogram.** / Every year beginning at age 67 years and continuing for as long as you are in good health. Consult with your health care provider.  Pap test.** / Every 3 years starting at age 75 years through age 87 or 78 years with 3 consecutive normal Pap tests. Testing can be stopped between 65 and 70 years with 3 consecutive normal Pap tests and no abnormal Pap or HPV tests in the past 10 years.  HPV screening.** / Every 3 years from ages 5 years through ages 14 or 34 years with a history of 3 consecutive normal Pap tests. Testing can be stopped between 65 and 70 years with 3 consecutive normal Pap tests and no abnormal Pap or HPV tests in the past 10 years.  Fecal occult blood test (FOBT) of stool. / Every year beginning at age 22 years and continuing until age 81 years. You may not need to do this test if you get a  colonoscopy every 10 years.  Flexible sigmoidoscopy or colonoscopy.** / Every 5 years for a flexible sigmoidoscopy or every 10 years for a colonoscopy beginning at age 32 years and continuing until age 62 years.  Hepatitis C blood test.** / For all people born from [birth year] through 1967 and any individual with known risks for hepatitis C.  Osteoporosis screening.** / A one-time screening for women ages 18 years and over and women at risk for fractures or osteoporosis.  Skin self-exam. / Monthly.  Influenza vaccine. / Every year.  Tetanus, diphtheria, and acellular pertussis (Tdap/Td) vaccine.** / 1 dose of Td every 10 years.  Varicella vaccine.** / Consult your health care provider.  Zoster vaccine.** / 1 dose for adults aged 70 years or older.  Pneumococcal 13-valent conjugate (PCV13) vaccine.** / Consult your health care provider.  Pneumococcal polysaccharide (PPSV23) vaccine.** / 1 dose for all adults aged 47 years and older.  Meningococcal vaccine.** / Consult your health care provider.  Hepatitis A vaccine.** / Consult your health care provider.  Hepatitis B vaccine.** / Consult your health care provider.  Haemophilus influenzae type b (Hib) vaccine.** / Consult your health care provider. ** Family history and personal history of risk and conditions may change your health care provider's recommendations.   This information is not intended to replace advice given to you by your health care provider. Make sure you discuss any questions you have with your health care provider.   Document Released: 12/07/2001 Document Revised: 11/01/2014 Document Reviewed: 03/08/2011 Elsevier Interactive Patient Education [employer].

## 2015-11-12 NOTE — Progress Notes (Signed)
Subjective:    Patient ID: Mckenzie Harris, female    DOB: June 03, 1976, 40 y.o.   MRN: [id number]  HPI  Pt wants to have complete physical   Paps mear in November was normal.  Mammogram was normal as well.  Pt does exercise. Some cardio about twice a week. But some inconstancy. Eats healthy. Pt does smoke about pack a month, Pt drinks some occasional soda. About 1-2 a week.   LMP- 3 weeks.   Pt has some symptoms sinus symptoms. Intermittent mild pressure. Some ear pressure as well. This has been going on for 3 wks. Some eyes watery and crusty early on. Pt thinks may have had cold. Some ear pressure last night and sinus pressure yesterday. Pt has tried sudafed and ibuprofen. No sinus pressure now.  Pt took PPI last night. Occasional burning sensation to stomach. Feels hoarse at times. St faint on and off. Had odd taste last night. Maybe sour tast.  Pt has history anxiety. Pt takes med occasionally. She feels panicky at times. Sometimes use if wakes up middle of the night and go to sleep.       Review of Systems  Constitutional: Negative for fever, chills and fatigue.  HENT: Positive for ear pain, sinus pressure and sore throat. Negative for congestion, facial swelling, nosebleeds, postnasal drip and sneezing.        See hpi.  Respiratory: Negative for cough, chest tightness, shortness of breath and wheezing.   Cardiovascular: Negative for chest pain and palpitations.  Gastrointestinal: Positive for abdominal pain. Negative for nausea, vomiting, diarrhea, constipation, blood in stool, abdominal distention and rectal pain.       See hpi.  Endocrine: Negative for polydipsia, polyphagia and polyuria.  Genitourinary: Negative for dysuria, urgency, flank pain and dyspareunia.  Musculoskeletal: Negative for back pain.  Skin: Negative for rash.  Psychiatric/Behavioral: Negative for suicidal ideas, confusion, sleep disturbance, self-injury, dysphoric mood, decreased concentration and  agitation. The patient is nervous/anxious.     Past Medical History  Diagnosis Date  . Anxiety   . HSV-2 infection     Social History   Social History  . Marital Status: Married    Spouse Name: N/A  . Number of Children: N/A  . Years of Education: N/A   Occupational History  . Preschool Teacher    Social History Main Topics  . Smoking status: Current Every Day Smoker -- 1.00 packs/day for 20 years  . Smokeless tobacco: Not on file  . Alcohol Use: Yes  . Drug Use: No  . Sexual Activity: Not Currently   Other Topics Concern  . Not on file   Social History Narrative   Regular exercise: no   Caffeine use: diet coke daily    No past surgical history on file.  Family History  Problem Relation Age of Onset  . Anxiety disorder    . Heart disease Father     heart attack     No Known Allergies  No current outpatient prescriptions on file prior to visit.   No current facility-administered medications on file prior to visit.    BP 98/70 mmHg  Pulse 75  Temp(Src) 97.6 F (36.4 C) (Oral)  Ht 5\' 5"  (1.651 m)  Wt 116 lb 3.2 oz (52.708 kg)  BMI 19.34 kg/m2  SpO2 99%  LMP 10/26/2015       Objective:   Physical Exam  General  Mental Status - Alert. General Appearance - Well groomed. Not in acute distress.  Skin Rashes-  No Rashes. On inspection of skin on back no worrisome lesions(pt mentioned saw derm within past year and negative surveillance)  HEENT Head- Normal. Ear Auditory Canal - Left- Normal. Right - Normal.Tympanic Membrane- Left- Normal. Right- Normal. Eye Sclera/Conjunctiva- Left- Normal. Right- Normal. Nose & Sinuses Nasal Mucosa- Left-  Boggy and Congested. Right-  Boggy and  Congested.Bilateral no  maxillary and no  frontal sinus pressure. Mouth & Throat Lips: Upper Lip- Normal: no dryness, cracking, pallor, cyanosis, or vesicular eruption. Lower Lip-Normal: no dryness, cracking, pallor, cyanosis or vesicular eruption. Buccal Mucosa-  Bilateral- No Aphthous ulcers. Oropharynx- No Discharge or Erythema. +pnd Tonsils: Characteristics- Bilateral- No Erythema or Congestion. Size/Enlargement- Bilateral- No enlargement. Discharge- bilateral-None.  Neck Neck- Supple. No Masses.   Chest and Lung Exam Auscultation: Breath Sounds:-Clear even and unlabored.  Cardiovascular Auscultation:Rythm- Regular, rate and rhythm. Murmurs & Other Heart Sounds:Ausculatation of the heart reveal- No Murmurs.  Lymphatic Head & Neck General Head & Neck Lymphatics: Bilateral: Description- No Localized lymphadenopathy.   Abdomen Inspection:-Inspection Normal.  Palpation/Perucssion: Palpation and Percussion of the abdomen reveal- Non Tender, No Rebound tenderness, No rigidity(Guarding) and No Palpable abdominal masses.  Liver:-Normal.  Spleen:- Normal.   Back- no cva tenderness       Assessment & Plan:  Pt expressed concern for serious diagnosis regarding her sinus and stomach symptoms. I explained will follow her closely if not improving would consider referral but nothing on today exam or history makes me consider need for aggressive work up or immediate referral.

## 2015-11-12 NOTE — Progress Notes (Signed)
Pre visit review using our clinic review tool, if applicable. No additional management support is needed unless otherwise documented below in the visit note. 

## 2015-11-12 NOTE — Assessment & Plan Note (Signed)
Cbc, cmp, tsh, lipid panel and ua. I am putting labs at future to be done fasting. Please get done in next 7-10 days.

## 2015-11-14 ENCOUNTER — Other Ambulatory Visit (INDEPENDENT_AMBULATORY_CARE_PROVIDER_SITE_OTHER): Payer: BLUE CROSS/BLUE SHIELD

## 2015-11-14 DIAGNOSIS — Z0000 Encounter for general adult medical examination without abnormal findings: Secondary | ICD-10-CM

## 2015-11-14 DIAGNOSIS — Z0189 Encounter for other specified special examinations: Secondary | ICD-10-CM | POA: Diagnosis not present

## 2015-11-14 LAB — LIPID PANEL
CHOL/HDL RATIO: 3
Cholesterol: 149 mg/dL (ref 0–200)
HDL: 51.7 mg/dL (ref >39.00)
LDL Cholesterol: 82 mg/dL (ref 0–99)
NonHDL: 96.96
TRIGLYCERIDES: 76 mg/dL (ref 0.0–149.0)
VLDL: 15.2 mg/dL (ref 0.0–40.0)

## 2015-11-14 LAB — CBC WITH DIFFERENTIAL/PLATELET
BASOS ABS: 0 10*3/uL (ref 0.0–0.1)
Basophils Relative: 0.4 % (ref 0.0–3.0)
Eosinophils Absolute: 0.1 10*3/uL (ref 0.0–0.7)
Eosinophils Relative: 1 % (ref 0.0–5.0)
HCT: 40.6 % (ref 36.0–46.0)
Hemoglobin: 13.9 g/dL (ref 12.0–15.0)
LYMPHS ABS: 1.3 10*3/uL (ref 0.7–4.0)
Lymphocytes Relative: 18.5 % (ref 12.0–46.0)
MCHC: 34.3 g/dL (ref 30.0–36.0)
MCV: 88.2 fl (ref 78.0–100.0)
MONO ABS: 0.4 10*3/uL (ref 0.1–1.0)
Monocytes Relative: 6 % (ref 3.0–12.0)
NEUTROS PCT: 74.1 % (ref 43.0–77.0)
Neutro Abs: 5.1 10*3/uL (ref 1.4–7.7)
Platelets: 262 10*3/uL (ref 150.0–400.0)
RBC: 4.6 Mil/uL (ref 3.87–5.11)
RDW: 13 % (ref 11.5–15.5)
WBC: 6.9 10*3/uL (ref 4.0–10.5)

## 2015-11-14 LAB — COMPREHENSIVE METABOLIC PANEL
ALT: 13 U/L (ref 0–35)
AST: 14 U/L (ref 0–37)
Albumin: 4.2 g/dL (ref 3.5–5.2)
Alkaline Phosphatase: 60 U/L (ref 39–117)
BUN: 12 mg/dL (ref 6–23)
CO2: 28 mEq/L (ref 19–32)
Calcium: 9.2 mg/dL (ref 8.4–10.5)
Chloride: 104 mEq/L (ref 96–112)
Creatinine, Ser: 0.77 mg/dL (ref 0.40–1.20)
GFR: 87.54 mL/min (ref >60.00)
Glucose, Bld: 89 mg/dL (ref 70–99)
Potassium: 3.9 mEq/L (ref 3.5–5.1)
SODIUM: 140 meq/L (ref 135–145)
Total Bilirubin: 0.9 mg/dL (ref 0.2–1.2)
Total Protein: 7.2 g/dL (ref 6.0–8.3)

## 2015-11-14 LAB — TSH: TSH: 1.67 u[IU]/mL (ref 0.35–4.50)

## 2015-12-15 ENCOUNTER — Telehealth: Payer: Self-pay | Admitting: Family Medicine

## 2015-12-15 DIAGNOSIS — J029 Acute pharyngitis, unspecified: Secondary | ICD-10-CM

## 2015-12-15 DIAGNOSIS — R490 Dysphonia: Secondary | ICD-10-CM

## 2015-12-15 DIAGNOSIS — H9209 Otalgia, unspecified ear: Secondary | ICD-10-CM

## 2015-12-15 NOTE — Telephone Encounter (Signed)
Relation to [id number] Call back number: [phone]    Reason for call:  Patient requesting a referral to ENT due to hoarse, ear pain and throat pain

## 2015-12-15 NOTE — Telephone Encounter (Signed)
Edward see note below and advise if ok to send referral.

## 2015-12-15 NOTE — Telephone Encounter (Signed)
I put in referral to ENT. Let pt know that if appointment delayed the offer her follow up here. I asked for referral to be made asap.

## 2015-12-16 NOTE — Telephone Encounter (Signed)
Called patient advised referral placed for ENT per ES. Advised if appointment delayed she could come back for follow up.

## 2016-01-09 ENCOUNTER — Telehealth: Payer: Self-pay | Admitting: Family Medicine

## 2016-01-09 NOTE — Telephone Encounter (Signed)
Refill #20 no refills since i have not seen her and edward is not here

## 2016-01-09 NOTE — Telephone Encounter (Signed)
Pharmacy: [id number] - [place], [place]  Reason for call: pt called for refill on xanax. She is out. Pt requesting send in today.

## 2016-01-09 NOTE — Telephone Encounter (Signed)
Last seen by [facility] 07/12/13. Please advise    KP

## 2016-01-12 ENCOUNTER — Encounter: Payer: Self-pay | Admitting: Family Medicine

## 2016-01-12 ENCOUNTER — Ambulatory Visit (INDEPENDENT_AMBULATORY_CARE_PROVIDER_SITE_OTHER): Payer: BLUE CROSS/BLUE SHIELD | Admitting: Family Medicine

## 2016-01-12 VITALS — BP 114/70 | HR 67 | Temp 98.7°F | Ht 65.0 in | Wt 113.2 lb

## 2016-01-12 DIAGNOSIS — F411 Generalized anxiety disorder: Secondary | ICD-10-CM

## 2016-01-12 DIAGNOSIS — R079 Chest pain, unspecified: Secondary | ICD-10-CM

## 2016-01-12 MED ORDER — ESCITALOPRAM OXALATE 10 MG PO TABS: 10.0000 mg | ORAL_TABLET | Freq: Every day | ORAL | Status: DC

## 2016-01-12 MED ORDER — ALPRAZOLAM 0.25 MG PO TABS: 0.2500 mg | ORAL_TABLET | Freq: Every evening | ORAL | Status: DC | PRN

## 2016-01-12 NOTE — Patient Instructions (Addendum)
Generalized Anxiety Disorder Generalized anxiety disorder (GAD) is a mental disorder. It interferes with life functions, including relationships, work, and school. GAD is different from normal anxiety, which everyone experiences at some point in their lives in response to specific life events and activities. Normal anxiety actually helps us prepare for and get through these life events and activities. Normal anxiety goes away after the event or activity is over.  GAD causes anxiety that is not necessarily related to specific events or activities. It also causes excess anxiety in proportion to specific events or activities. The anxiety associated with GAD is also difficult to control. GAD can vary from mild to severe. People with severe GAD can have intense waves of anxiety with physical symptoms (panic attacks).  SYMPTOMS The anxiety and worry associated with GAD are difficult to control. This anxiety and worry are related to many life events and activities and also occur more days than not for 6 months or longer. People with GAD also have three or more of the following symptoms (one or more in children):  Restlessness.   Fatigue.  Difficulty concentrating.   Irritability.  Muscle tension.  Difficulty sleeping or unsatisfying sleep. DIAGNOSIS GAD is diagnosed through an assessment by your health care provider. Your health care provider will ask you questions aboutyour mood,physical symptoms, and events in your life. Your health care provider may ask you about your medical history and use of alcohol or drugs, including prescription medicines. Your health care provider may also do a physical exam and blood tests. Certain medical conditions and the use of certain substances can cause symptoms similar to those associated with GAD. Your health care provider may refer you to a mental health specialist for further evaluation. TREATMENT The following therapies are usually used to treat GAD:    Medication. Antidepressant medication usually is prescribed for long-term daily control. Antianxiety medicines may be added in severe cases, especially when panic attacks occur.   Talk therapy (psychotherapy). Certain types of talk therapy can be helpful in treating GAD by providing support, education, and guidance. A form of talk therapy called cognitive behavioral therapy can teach you healthy ways to think about and react to daily life events and activities.  Stress managementtechniques. These include yoga, meditation, and exercise and can be very helpful when they are practiced regularly. A mental health specialist can help determine which treatment is best for you. Some people see improvement with one therapy. However, other people require a combination of therapies.   This information is not intended to replace advice given to you by your health care provider. Make sure you discuss any questions you have with your health care provider.   Document Released: 02/05/2013 Document Revised: 11/01/2014 Document Reviewed: 02/05/2013 Elsevier Interactive Patient Education 2016 Elsevier Inc.  

## 2016-01-12 NOTE — Progress Notes (Signed)
Patient ID: Mckenzie Harris, female    DOB: Sep 03, 1976  Age: 40 y.o. MRN: [id number]    Subjective:  Subjective HPI Mckenzie Harris presents for c/o chest pain and pressure  Review of Systems  Constitutional: Negative for diaphoresis, appetite change, fatigue and unexpected weight change.  Eyes: Negative for pain, redness and visual disturbance.  Respiratory: Negative for cough, chest tightness, shortness of breath and wheezing.   Cardiovascular: Negative for chest pain, palpitations and leg swelling.  Endocrine: Negative for cold intolerance, heat intolerance, polydipsia, polyphagia and polyuria.  Genitourinary: Negative for dysuria, frequency and difficulty urinating.  Neurological: Negative for dizziness, light-headedness, numbness and headaches.    History Past Medical History  Diagnosis Date  . Anxiety   . HSV-2 infection     She has no past surgical history on file.   Her family history includes Atrial fibrillation in her father; Heart disease (age of onset: 34) in her father; Hyperlipidemia in her father; Hypertension in her father.She reports that she has been smoking.  She does not have any smokeless tobacco history on file. She reports that she drinks alcohol. She reports that she does not use illicit drugs.  Current Outpatient Prescriptions on File Prior to Visit  Medication Sig Dispense Refill  . ALPRAZolam (XANAX) 0.25 MG tablet Take 1 tablet (0.25 mg total) by mouth at bedtime as needed for anxiety or sleep. 20 tablet 0   No current facility-administered medications on file prior to visit.     Objective:  Objective Physical Exam  Constitutional: She is oriented to person, place, and time. She appears well-developed and well-nourished.  HENT:  Head: Normocephalic and atraumatic.  Eyes: Conjunctivae and EOM are normal.  Neck: Normal range of motion. Neck supple. No JVD present. Carotid bruit is not present. No thyromegaly present.  Cardiovascular: Normal rate,  regular rhythm and normal heart sounds.   No murmur heard. Pulmonary/Chest: Effort normal and breath sounds normal. No respiratory distress. She has no wheezes. She has no rales. She exhibits no tenderness.  Musculoskeletal: She exhibits no edema.  Neurological: She is alert and oriented to person, place, and time.  Psychiatric: Her speech is normal. Judgment normal. Her mood appears anxious. Cognition and memory are normal.  Nursing note and vitals reviewed.  BP 114/70 mmHg  Pulse 67  Temp(Src) 98.7 F (37.1 C) (Oral)  Ht 5\' 5"  (1.651 m)  Wt 113 lb 3.2 oz (51.347 kg)  BMI 18.84 kg/m2  SpO2 99%  LMP 12/15/2015 Wt Readings from Last 3 Encounters:  01/12/16 113 lb 3.2 oz (51.347 kg)  11/12/15 116 lb 3.2 oz (52.708 kg)  01/22/14 108 lb (48.988 kg)     Lab Results  Component Value Date   WBC 6.9 11/14/2015   HGB 13.9 11/14/2015   HCT 40.6 11/14/2015   PLT 262.0 11/14/2015   GLUCOSE 89 11/14/2015   CHOL 149 11/14/2015   TRIG 76.0 11/14/2015   HDL 51.70 11/14/2015   LDLCALC 82 11/14/2015   ALT 13 11/14/2015   AST 14 11/14/2015   NA 140 11/14/2015   K 3.9 11/14/2015   CL 104 11/14/2015   CREATININE 0.77 11/14/2015   BUN 12 11/14/2015   CO2 28 11/14/2015   TSH 1.67 11/14/2015    Mm Digital Diagnostic Unilat L  06/27/2014  CLINICAL DATA:  Screening callback for questioned left breast masses upper outer quadrant and lower inner quadrant at baseline mammography 06/11/2014 EXAM: DIGITAL DIAGNOSTIC  left MAMMOGRAM ULTRASOUND left BREAST COMPARISON:  Prior left breast ultrasound  only 05/13/2006 ACR Breast Density Category c: The breast tissue is heterogeneously dense, which may obscure small masses. FINDINGS: There is persistent nodularity and heterogeneously dense breast parenchyma in the left upper outer quadrant and left lower inner quadrant, respectively. On physical exam, I palpate no abnormality in the left upper outer quadrant or left lower inner quadrant. Ultrasound is  performed, showing an anechoic simple appearing cyst left breast 2 o'clock location 5 cm from the nipple measuring 9 x 6 x 5 mm. This corresponds to the mammographic finding. No sonographic abnormality is identified in the left lower inner quadrant. IMPRESSION: No evidence for malignancy in the left breast. RECOMMENDATION: Screening mammogram in one year.(Code:SM-B-01Y) I have discussed the findings and recommendations with the patient. Results were also provided in writing at the conclusion of the visit. If applicable, a reminder letter will be sent to the patient regarding the next appointment. BI-RADS CATEGORY  2: Benign. Electronically Signed   By: Conchita Paris M.D.   On: 06/27/2014 15:14   US Breast Ltd Uni Left Inc Axilla  06/27/2014  CLINICAL DATA:  Screening callback for questioned left breast masses upper outer quadrant and lower inner quadrant at baseline mammography 06/11/2014 EXAM: DIGITAL DIAGNOSTIC  left MAMMOGRAM ULTRASOUND left BREAST COMPARISON:  Prior left breast ultrasound only 05/13/2006 ACR Breast Density Category c: The breast tissue is heterogeneously dense, which may obscure small masses. FINDINGS: There is persistent nodularity and heterogeneously dense breast parenchyma in the left upper outer quadrant and left lower inner quadrant, respectively. On physical exam, I palpate no abnormality in the left upper outer quadrant or left lower inner quadrant. Ultrasound is performed, showing an anechoic simple appearing cyst left breast 2 o'clock location 5 cm from the nipple measuring 9 x 6 x 5 mm. This corresponds to the mammographic finding. No sonographic abnormality is identified in the left lower inner quadrant. IMPRESSION: No evidence for malignancy in the left breast. RECOMMENDATION: Screening mammogram in one year.(Code:SM-B-01Y) I have discussed the findings and recommendations with the patient. Results were also provided in writing at the conclusion of the visit. If applicable, a  reminder letter will be sent to the patient regarding the next appointment. BI-RADS CATEGORY  2: Benign. Electronically Signed   By: Conchita Paris M.D.   On: 06/27/2014 15:14     Assessment & Plan:  Plan I have discontinued Ms. Swallows's mometasone and esomeprazole. I am also having her start on escitalopram. Additionally, I am having her maintain her ALPRAZolam.  Meds ordered this encounter  Medications  . escitalopram (LEXAPRO) 10 MG tablet    Sig: Take 1 tablet (10 mg total) by mouth daily.    Dispense:  30 tablet    Refill:  2    Problem List Items Addressed This Visit      Unprioritized   Anxiety state    con't xanax con't lexapro rto 1 month or sooner prn      Relevant Medications   escitalopram (LEXAPRO) 10 MG tablet    Other Visit Diagnoses    Chest pain, unspecified chest pain type    -  Primary    Relevant Orders    EKG 12-Lead (Completed)    Generalized anxiety disorder        Relevant Medications    escitalopram (LEXAPRO) 10 MG tablet       Follow-up: Return in about 4 weeks (around 02/09/2016), or if symptoms worsen or fail to improve, for anxiety.  Garnet Koyanagi, DO

## 2016-01-12 NOTE — Telephone Encounter (Signed)
Rx filled and sent to [facility]

## 2016-01-12 NOTE — Progress Notes (Signed)
Pre visit review using our clinic review tool, if applicable. No additional management support is needed unless otherwise documented below in the visit note. 

## 2016-01-12 NOTE — Telephone Encounter (Signed)
Would you check and see if any recent rx of xanax. Looks like Dr Etter Sjogren wrote a note ok to fill 20 tabs but I don't see it on her med list?? I don't want to write duplicate rx for her. If not was written. I am willing to rx about 20 tabs of xanax. Same sig as previous but she needs to follow up since she I have only seen her one time. And not prescribing long term xanax without further info/discussion on her anxiety.

## 2016-01-12 NOTE — Assessment & Plan Note (Signed)
con't xanax con't lexapro rto 1 month or sooner prn

## 2016-01-12 NOTE — Telephone Encounter (Signed)
Ok to fill for #30 with 0 as written in January? This pt seen you in January for her CPE. Please advise?

## 2016-01-16 ENCOUNTER — Ambulatory Visit (INDEPENDENT_AMBULATORY_CARE_PROVIDER_SITE_OTHER): Payer: BLUE CROSS/BLUE SHIELD | Admitting: Family

## 2016-01-16 ENCOUNTER — Encounter: Payer: Self-pay | Admitting: Family

## 2016-01-16 VITALS — BP 103/72 | HR 89 | Temp 99.0°F | Resp 16 | Ht 65.0 in | Wt 111.4 lb

## 2016-01-16 DIAGNOSIS — J029 Acute pharyngitis, unspecified: Secondary | ICD-10-CM | POA: Diagnosis not present

## 2016-01-16 DIAGNOSIS — J101 Influenza due to other identified influenza virus with other respiratory manifestations: Secondary | ICD-10-CM | POA: Diagnosis not present

## 2016-01-16 DIAGNOSIS — B349 Viral infection, unspecified: Secondary | ICD-10-CM

## 2016-01-16 LAB — POC INFLUENZA A&B (BINAX/QUICKVUE)
Influenza A, POC: NEGATIVE
Influenza B, POC: POSITIVE — AB

## 2016-01-16 LAB — POCT RAPID STREP A (OFFICE): Rapid Strep A Screen: NEGATIVE

## 2016-01-16 NOTE — Progress Notes (Signed)
Pre visit review using our clinic review tool, if applicable. No additional management support is needed unless otherwise documented below in the visit note. 

## 2016-01-16 NOTE — Progress Notes (Signed)
   Subjective:    Patient ID: Mckenzie Harris, female    DOB: [birth year], 40 y.o.   MRN: [id number]  HPI  [name] is a 40 yr old female who presents today with chief complaint of sore throat and cough. Symptoms began on Tuesday 01/13/16.  She reports that she had fever and body aches on Wednesday.  Reports daughter was mildly sick over the weekend.  Reports subjective temp.  Cough is worse yesterday.     Review of Systems    see HPI  Past Medical History  Diagnosis Date  . Anxiety   . HSV-2 infection     Social History   Social History  . Marital Status: Married    Spouse Name: N/A  . Number of Children: N/A  . Years of Education: N/A   Occupational History  . Preschool Teacher    Social History Main Topics  . Smoking status: Current Every Day Smoker -- 1.00 packs/day for 20 years  . Smokeless tobacco: Not on file  . Alcohol Use: Yes  . Drug Use: No  . Sexual Activity: Not Currently   Other Topics Concern  . Not on file   Social History Narrative   Regular exercise: no   Caffeine use: diet coke daily    No past surgical history on file.  Family History  Problem Relation Age of Onset  . Anxiety disorder    . Heart disease Father 58    heart attack   . Hyperlipidemia Father   . Hypertension Father   . Atrial fibrillation Father     ablation    No Known Allergies  Current Outpatient Prescriptions on File Prior to Visit  Medication Sig Dispense Refill  . ALPRAZolam (XANAX) 0.25 MG tablet Take 1 tablet (0.25 mg total) by mouth at bedtime as needed for anxiety or sleep. 20 tablet 0  . escitalopram (LEXAPRO) 10 MG tablet Take 1 tablet (10 mg total) by mouth daily. 30 tablet 2   No current facility-administered medications on file prior to visit.    BP 103/72 mmHg  Pulse 89  Temp(Src) 99 F (37.2 C) (Oral)  Resp 16  Ht 5\' 5"  (1.651 m)  Wt 111 lb 6.4 oz (50.531 kg)  BMI 18.54 kg/m2  SpO2 100%  LMP 01/16/2016    Objective:   Physical Exam    Constitutional: She is oriented to person, place, and time. She appears well-developed and well-nourished.  HENT:  Right Ear: Tympanic membrane and ear canal normal.  Left Ear: Tympanic membrane and ear canal normal.  Mouth/Throat: Posterior oropharyngeal erythema present. No oropharyngeal exudate or posterior oropharyngeal edema.  Cardiovascular: Normal rate, regular rhythm and normal heart sounds.   No murmur heard. Pulmonary/Chest: Effort normal and breath sounds normal. No respiratory distress. She has no wheezes.  Musculoskeletal: She exhibits no edema.  Lymphadenopathy:    She has cervical adenopathy.  Neurological: She is alert and oriented to person, place, and time.  Skin: Skin is warm and dry.  Psychiatric: She has a normal mood and affect. Her behavior is normal. Judgment and thought content normal.          Assessment & Plan:  Influenza B-  Rapid strep negative.  Rapid Flu positive for flu B.  Advised pt on supportive measures and follow up as outlined in AVS.

## 2016-01-16 NOTE — Addendum Note (Signed)
Addended by: Kelle Darting A on: 01/16/2016 05:30 PM   Modules accepted: Orders

## 2016-01-16 NOTE — Patient Instructions (Signed)
Drink plenty of fluids and rest. Call if new or worsening symptoms, if fever >101 or if symptoms are not improved in 3 days.

## 2016-02-09 ENCOUNTER — Encounter: Payer: Self-pay | Admitting: Medical

## 2016-02-09 ENCOUNTER — Telehealth: Payer: Self-pay | Admitting: Medical

## 2016-02-09 ENCOUNTER — Ambulatory Visit (INDEPENDENT_AMBULATORY_CARE_PROVIDER_SITE_OTHER): Payer: BLUE CROSS/BLUE SHIELD | Admitting: Medical

## 2016-02-09 VITALS — BP 100/60 | HR 71 | Temp 98.0°F | Ht 65.0 in | Wt 112.8 lb

## 2016-02-09 DIAGNOSIS — R300 Dysuria: Secondary | ICD-10-CM | POA: Diagnosis not present

## 2016-02-09 DIAGNOSIS — R82998 Other abnormal findings in urine: Secondary | ICD-10-CM

## 2016-02-09 DIAGNOSIS — N390 Urinary tract infection, site not specified: Secondary | ICD-10-CM | POA: Diagnosis not present

## 2016-02-09 LAB — POC URINALSYSI DIPSTICK (AUTOMATED)
Bilirubin, UA: NEGATIVE
GLUCOSE UA: NEGATIVE
Ketones, UA: NEGATIVE
Urobilinogen, UA: 0.2
pH, UA: 6

## 2016-02-09 MED ORDER — PHENAZOPYRIDINE HCL 200 MG PO TABS: 200.0000 mg | ORAL_TABLET | Freq: Three times a day (TID) | ORAL | Status: DC | PRN

## 2016-02-09 MED ORDER — NITROFURANTOIN MONOHYD MACRO 100 MG PO CAPS
100.0000 mg | ORAL_CAPSULE | Freq: Two times a day (BID) | ORAL | Status: DC
Start: 2016-02-09 — End: 2017-11-17

## 2016-02-09 NOTE — Progress Notes (Signed)
   Subjective:    Patient ID: Mckenzie Harris, female    DOB: [birth year], 40 y.o.   MRN: [id number]  HPI   Pt in today reporting urinary symptoms for 1 day.  Dysuria- yes Frequent urination- yes Hesitancy-no  Suprapubic pressure-yes Fever-no chills-no Nausea-no Vomiting-no CVA pain-no History of UTI-yes. Occasional in the past Gross hematuria-no  LMP- presently(just about to start). Just short of a month ago.  Review of Systems  Constitutional: Negative for fever, chills and fatigue.  Genitourinary: Positive for dysuria and frequency. Negative for urgency, hematuria, vaginal bleeding, vaginal pain and menstrual problem.  Musculoskeletal: Negative for back pain.  Hematological: Negative for adenopathy. Does not bruise/bleed easily.  Psychiatric/Behavioral: Negative for behavioral problems and confusion.   Past Medical History  Diagnosis Date  . Anxiety   . HSV-2 infection      Social History   Social History  . Marital Status: Married    Spouse Name: N/A  . Number of Children: N/A  . Years of Education: N/A   Occupational History  . Preschool Teacher    Social History Main Topics  . Smoking status: Current Every Day Smoker -- 1.00 packs/day for 20 years  . Smokeless tobacco: Not on file  . Alcohol Use: Yes  . Drug Use: No  . Sexual Activity: Not Currently   Other Topics Concern  . Not on file   Social History Narrative   Regular exercise: no   Caffeine use: diet coke daily    No past surgical history on file.  Family History  Problem Relation Age of Onset  . Anxiety disorder    . Heart disease Father 23    heart attack   . Hyperlipidemia Father   . Hypertension Father   . Atrial fibrillation Father     ablation    No Known Allergies  Current Outpatient Prescriptions on File Prior to Visit  Medication Sig Dispense Refill  . ALPRAZolam (XANAX) 0.25 MG tablet Take 1 tablet (0.25 mg total) by mouth at bedtime as needed for anxiety or sleep. 20  tablet 0  . escitalopram (LEXAPRO) 10 MG tablet Take 1 tablet (10 mg total) by mouth daily. 30 tablet 2   No current facility-administered medications on file prior to visit.    BP 100/60 mmHg  Pulse 71  Temp(Src) 98 F (36.7 C) (Oral)  Ht 5\' 5"  (1.651 m)  Wt 112 lb 12.8 oz (51.166 kg)  BMI 18.77 kg/m2  SpO2 96%  LMP 01/16/2016       Objective:   Physical Exam  General Appearance- Not in acute distress.  HEENT Eyes- Scleraeral/Conjuntiva-bilat- Not Yellow. Mouth & Throat- Normal.  Chest and Lung Exam Auscultation: Breath sounds:-Normal. Adventitious sounds:- No Adventitious sounds.  Cardiovascular Auscultation:Rythm - Regular. Heart Sounds -Normal heart sounds.  Abdomen Inspection:-Inspection Normal.  Palpation/Perucssion: Palpation and Percussion of the abdomen reveal- Non Tender, No Rebound tenderness, No rigidity(Guarding) and No Palpable abdominal masses.  Liver:-Normal.  Spleen:- Normal.   Back- no cva tenderness.      Assessment & Plan:  You  appear to have a urinary tract infection. I am prescribing macrobid antibiotic for the probable infection. Hydrate well. I am sending out a urine culture. During the interim if your signs and symptoms worsen rather than improving please notify us. We will notify you when the culture results are back.  Also rx of pyridium for pain.  Follow up in 7 days or as needed.

## 2016-02-09 NOTE — Telephone Encounter (Signed)
Please advise  HM.

## 2016-02-09 NOTE — Telephone Encounter (Signed)
Left message for pt that provider advised the pt to see if the patient would like the medication sent to another pharmacy and to start taking the OTC azostandard.

## 2016-02-09 NOTE — Progress Notes (Signed)
Pre visit review using our clinic review tool, if applicable. No additional management support is needed unless otherwise documented below in the visit note. 

## 2016-02-09 NOTE — Telephone Encounter (Signed)
Can be reached: [phone] Pharmacy:[id number] - [place], [place]  Reason for call: Pt states pharmacy texted her that Phenazopyridine is not covered by insurance and she would like something else sent in.

## 2016-02-09 NOTE — Patient Instructions (Addendum)
You appear to have a urinary tract infection. I am prescribing macrobid antibiotic for the probable infection. Hydrate well. I am sending out a urine culture. During the interim if your signs and symptoms worsen rather than improving please notify us. We will notify you when the culture results are back.  Also rx of pyridium for pain.  Follow up in 7 days or as needed.

## 2016-02-09 NOTE — Telephone Encounter (Signed)
Advise to get azostandard otc. Never heard pyridium not covered. Maybe send pyr[facility] to [facility]. See if on $4 list of meds.

## 2016-02-11 LAB — URINE CULTURE

## 2016-02-14 ENCOUNTER — Emergency Department (HOSPITAL_COMMUNITY)
Admission: EM | Admit: 2016-02-14 | Discharge: 2016-02-14 | Disposition: A | Discharge status: Home / Self Care | Payer: BLUE CROSS/BLUE SHIELD | Attending: Emergency Medicine | Admitting: Emergency Medicine

## 2016-02-14 ENCOUNTER — Emergency Department (HOSPITAL_COMMUNITY): Payer: BLUE CROSS/BLUE SHIELD

## 2016-02-14 ENCOUNTER — Encounter (HOSPITAL_COMMUNITY): Payer: Self-pay | Admitting: Family Medicine

## 2016-02-14 DIAGNOSIS — Z792 Long term (current) use of antibiotics: Secondary | ICD-10-CM | POA: Diagnosis not present

## 2016-02-14 DIAGNOSIS — J209 Acute bronchitis, unspecified: Secondary | ICD-10-CM | POA: Diagnosis not present

## 2016-02-14 DIAGNOSIS — Z8619 Personal history of other infectious and parasitic diseases: Secondary | ICD-10-CM | POA: Insufficient documentation

## 2016-02-14 DIAGNOSIS — Z87891 Personal history of nicotine dependence: Secondary | ICD-10-CM | POA: Diagnosis not present

## 2016-02-14 DIAGNOSIS — Z79899 Other long term (current) drug therapy: Secondary | ICD-10-CM | POA: Insufficient documentation

## 2016-02-14 DIAGNOSIS — F419 Anxiety disorder, unspecified: Secondary | ICD-10-CM | POA: Diagnosis not present

## 2016-02-14 DIAGNOSIS — M545 Low back pain: Secondary | ICD-10-CM | POA: Insufficient documentation

## 2016-02-14 DIAGNOSIS — R079 Chest pain, unspecified: Secondary | ICD-10-CM | POA: Diagnosis present

## 2016-02-14 LAB — BASIC METABOLIC PANEL
Anion gap: 12 (ref 5–15)
BUN: 9 mg/dL (ref 6–20)
CO2: 25 mmol/L (ref 22–32)
Calcium: 8.8 mg/dL — ABNORMAL LOW (ref 8.9–10.3)
Chloride: 103 mmol/L (ref 101–111)
Creatinine, Ser: 0.71 mg/dL (ref 0.44–1.00)
GFR calc Af Amer: >60 mL/min (ref >60)
GFR calc non Af Amer: >60 mL/min (ref >60)
Glucose, Bld: 102 mg/dL — ABNORMAL HIGH (ref 65–99)
Potassium: 3.8 mmol/L (ref 3.5–5.1)
SODIUM: 140 mmol/L (ref 135–145)

## 2016-02-14 LAB — CBC
HEMATOCRIT: 35.7 % — AB (ref 36.0–46.0)
Hemoglobin: 12.5 g/dL (ref 12.0–15.0)
MCH: 29 pg (ref 26.0–34.0)
MCHC: 35 g/dL (ref 30.0–36.0)
MCV: 82.8 fL (ref 78.0–100.0)
Platelets: 214 10*3/uL (ref 150–400)
RBC: 4.31 MIL/uL (ref 3.87–5.11)
RDW: 13.2 % (ref 11.5–15.5)
WBC: 11.8 10*3/uL — AB (ref 4.0–10.5)

## 2016-02-14 LAB — I-STAT TROPONIN, ED: Troponin i, poc: 0 ng/mL (ref 0.00–0.08)

## 2016-02-14 NOTE — Discharge Instructions (Signed)
Your blood tests and chest xray are consistent with a bronchitis. This is inflammation of the airways almost always due to a viral infection. This is usually a self-limited process that resolves within about 2 weeks with supportive treatments.  I recommend taking a NSAID type medicine (motrin, ibuprofen) regularly 800mg  3 times daily or 600mg  4 times daily for the next week to help calm down this inflammation. You can also cough medication as needed if your cough becomes more significant.  If your symptoms are getting much worse or have not started improving next week you should consider seeing your PCP or another physician to be re-evaluated.

## 2016-02-14 NOTE — ED Notes (Signed)
Pt here for chest pain, back pain and pain with breathing and cough that started monday. sts also started taking abx Monday for UTI. sts that also she missed her lexapro. Denies being anxious.

## 2016-02-14 NOTE — ED Provider Notes (Signed)
CSN: [id number]     Arrival date & time 02/14/16  1032 History   First MD Initiated Contact with Patient 02/14/16 1053     Chief Complaint  Patient presents with  . Chest Pain  . Back Pain    (Consider location/radiation/quality/duration/timing/severity/associated sxs/prior Treatment) Patient is a 40 y.o. female presenting with chest pain and back pain. The history is provided by the patient and a relative.  Chest Pain Associated symptoms: back pain and cough   Associated symptoms: no dizziness and no fever   Back Pain Associated symptoms: chest pain   Associated symptoms: no fever    40 y/o woman with Hx of anxiety presents with diffuse chest, neck, back pain ongoing since about 4 days ago. 5 days ago she saw her PCP for symptoms of dysuria and was started on nitrofurantoin for a UTI. She has not experienced any dyspnea on exertion or shortness of breath associated with these symptoms. She feels less rested and her pain is worst upon waking up in the morning and when coughing. She denies any fevers. She had otherwise been feeling well after recovering from influenza about 3 weeks ago.  Past Medical History  Diagnosis Date  . Anxiety   . HSV-2 infection    History reviewed. No pertinent past surgical history. Family History  Problem Relation Age of Onset  . Anxiety disorder    . Heart disease Father 59    heart attack   . Hyperlipidemia Father   . Hypertension Father   . Atrial fibrillation Father     ablation   Social History  Substance Use Topics  . Smoking status: Former Smoker -- 0.00 packs/day for 20 years  . Smokeless tobacco: None  . Alcohol Use: Yes   OB History    No data available     Review of Systems  Constitutional: Negative for fever.  HENT: Negative for rhinorrhea and sore throat.   Eyes: Negative for discharge.  Respiratory: Positive for cough and wheezing. Negative for chest tightness.   Cardiovascular: Positive for chest pain.  Gastrointestinal:  Negative for diarrhea.  Musculoskeletal: Positive for back pain.  Skin: Negative for rash.  Allergic/Immunologic: Negative for environmental allergies.  Neurological: Negative for dizziness.  Hematological: Negative for adenopathy.  Psychiatric/Behavioral: The patient is nervous/anxious.       Allergies  Review of patient's allergies indicates no known allergies.  Home Medications   Prior to Admission medications   Medication Sig Start Date End Date Taking? Authorizing Provider  ALPRAZolam (XANAX) 0.25 MG tablet Take 1 tablet (0.25 mg total) by mouth at bedtime as needed for anxiety or sleep. 01/12/16  Yes Edward Saguier, PA-C  escitalopram (LEXAPRO) 10 MG tablet Take 1 tablet (10 mg total) by mouth daily. 01/12/16  Yes Yvonne R Lowne Chase, DO  ibuprofen (ADVIL,MOTRIN) 400 MG tablet Take 400 mg by mouth every 6 (six) hours as needed for mild pain.   Yes Historical Provider, MD  nitrofurantoin, macrocrystal-monohydrate, (MACROBID) 100 MG capsule Take 1 capsule (100 mg total) by mouth 2 (two) times daily. 02/09/16  Yes Edward Saguier, PA-C  phenazopyridine (PYRIDIUM) 200 MG tablet Take 1 tablet (200 mg total) by mouth 3 (three) times daily as needed for pain. 02/09/16  Yes Edward Saguier, PA-C   BP 105/64 mmHg  Pulse 85  Temp(Src) 98.1 F (36.7 C)  Resp 16  Ht 5\' 5"  (1.651 m)  Wt 50.576 kg  BMI 18.55 kg/m2  SpO2 100%  LMP 01/16/2016 Physical Exam GENERAL- alert, co-operative,  NAD HEENT- Conjunctiva noninjected, oral mucosa appears moist, no cervical LN enlargement, neck supple normal ROM CARDIAC- RRR, no murmurs, rubs or gallops. RESP- Good air movement, few faint wheezes on right ABDOMEN- Soft, nontender, no guarding or rebound EXTREMITIES- symmetric, no pedal edema. SKIN- Warm, dry, No rash or lesion. PSYCH- Normal mood and affect, appropriate thought content and speech.   ED Course  Procedures (including critical care time) Labs Review Labs Reviewed  BASIC METABOLIC  PANEL - Abnormal; Notable for the following:    Glucose, Bld 102 (*)    Calcium 8.8 (*)    All other components within normal limits  CBC - Abnormal; Notable for the following:    WBC 11.8 (*)    HCT 35.7 (*)    All other components within normal limits  I-STAT TROPOININ, ED    Imaging Review Dg Chest 2 View  02/14/2016  CLINICAL DATA:  Pt here for chest pain, back pain and pain with breathing and cough that started Monday. Pt having some SOB with deep inspiration. Sts also started taking antibiotics Monday for UTI. No previous hx. EXAM: CHEST  2 VIEW COMPARISON:  None. FINDINGS: Heart size is normal. There is perihilar peribronchial thickening. There are no focal consolidations or pleural effusions. No pulmonary edema. Visualized osseous structures have a normal appearance. IMPRESSION: 1. Bronchitic changes. 2.  No focal acute pulmonary abnormality. Electronically Signed   By: Nolon Nations M.D.   On: 02/14/2016 11:36   I have personally reviewed and evaluated these images and lab results as part of my medical decision-making.   EKG Interpretation   Date/Time:  Saturday February 14 2016 10:39:52 EDT Ventricular Rate:  104 PR Interval:  124 QRS Duration: 82 QT Interval:  328 QTC Calculation: 431 R Axis:   95 Text Interpretation:  Sinus tachycardia Rightward axis No previous tracing  Confirmed by Ashok Cordia  MD, Lennette Bihari ([id number]) on 02/14/2016 10:59:15 AM Also  confirmed by Ashok Cordia  MD, Lennette Bihari ([id number]), editor Rolla Plate, Joelene Millin [phone])  on  02/14/2016 11:33:16 AM      MDM   Final diagnoses:  Acute pharyngitis, unspecified etiology   40 y/o woman with somewhat diffuse aches including chest, back, neck pain while on treatment for UTI. EKG, troponin negative for ACS. Not associated with shortness of breath but she is coughing and infrequently wheezing. Does have leukocytosis of 11.8 although no fever or systemic symptoms. CXR demonstrating bronchitic changes. Most likely she has acute bronchitis  symptoms that could be from her resolving flu or from a new viral infection. Plan is to discharge home with supportive care with NSAIDs, cough medication, and instructions given for return if symptoms worsen substantially.    Collier Salina, MD 02/14/16 [place], MD 02/15/16 [phone]

## 2016-02-14 NOTE — ED Notes (Signed)
Phlebotomy at the bedside  

## 2016-04-22 ENCOUNTER — Other Ambulatory Visit: Payer: Self-pay | Admitting: Family Medicine

## 2016-05-10 ENCOUNTER — Encounter: Payer: Self-pay | Admitting: Family Medicine

## 2016-06-15 ENCOUNTER — Ambulatory Visit (INDEPENDENT_AMBULATORY_CARE_PROVIDER_SITE_OTHER): Payer: BLUE CROSS/BLUE SHIELD | Admitting: Family Medicine

## 2016-06-15 ENCOUNTER — Encounter: Payer: Self-pay | Admitting: Family Medicine

## 2016-06-15 VITALS — BP 100/70 | HR 78 | Temp 98.9°F | Wt 118.6 lb

## 2016-06-15 DIAGNOSIS — N390 Urinary tract infection, site not specified: Secondary | ICD-10-CM | POA: Diagnosis not present

## 2016-06-15 DIAGNOSIS — R300 Dysuria: Secondary | ICD-10-CM | POA: Diagnosis not present

## 2016-06-15 DIAGNOSIS — T753XXA Motion sickness, initial encounter: Secondary | ICD-10-CM

## 2016-06-15 DIAGNOSIS — R319 Hematuria, unspecified: Secondary | ICD-10-CM

## 2016-06-15 DIAGNOSIS — R81 Glycosuria: Secondary | ICD-10-CM | POA: Diagnosis not present

## 2016-06-15 LAB — POC URINALSYSI DIPSTICK (AUTOMATED)
Ketones, UA: NEGATIVE
Nitrite, UA: POSITIVE
PH UA: 6
Protein, UA: NEGATIVE
Spec Grav, UA: 1.01
Urobilinogen, UA: 2

## 2016-06-15 LAB — GLUCOSE, POCT (MANUAL RESULT ENTRY): POC Glucose: 112 mg/dl — AB (ref 70–99)

## 2016-06-15 MED ORDER — CIPROFLOXACIN HCL 500 MG PO TABS: 500.0000 mg | ORAL_TABLET | Freq: Two times a day (BID) | ORAL | 0 refills | Status: DC

## 2016-06-15 MED ORDER — SCOPOLAMINE 1 MG/3DAYS TD PT72
1.0000 | MEDICATED_PATCH | TRANSDERMAL | 0 refills | Status: DC
Start: 2016-06-15 — End: 2017-11-17

## 2016-06-15 NOTE — Patient Instructions (Signed)

## 2016-06-15 NOTE — Progress Notes (Signed)
Pre visit review using our clinic review tool, if applicable. No additional management support is needed unless otherwise documented below in the visit note. 

## 2016-06-15 NOTE — Progress Notes (Signed)
Subjective:    Mckenzie Harris is a 40 y.o. female who complains of dysuria, frequency and urgency. She has had symptoms for 1 day.. Patient denies back pain, congestion, cough, fever, headache, rhinitis, sorethroat, stomach ache and vaginal discharge. Patient does have a history of recurrent UTI. Patient does not have a history of pyelonephritis.   The following portions of the patient's history were reviewed and updated as appropriate: allergies, current medications, past family history, past medical history, past social history, past surgical history and problem list.  Review of Systems Pertinent items are noted in HPI.    Objective:    BP 100/70 (BP Location: Left Arm, Patient Position: Sitting, Cuff Size: Normal)   Pulse 78   Temp 98.9 F (37.2 C) (Oral)   Wt 118 lb 9.6 oz (53.8 kg)   SpO2 98%   BMI 19.74 kg/m  General appearance: alert, cooperative, appears stated age and no distress Lungs: clear to auscultation bilaterally Heart: S1, S2 normal Extremities: extremities normal, atraumatic, no cyanosis or edema  Laboratory:  Urine dipstick: trace for glucose, 2+ for hemoglobin, 3+ for leukocyte esterase and positive for nitrites.   Micro exam: not done.    Assessment:    Acute cystitis and UTI     Plan:    Medications: ciprofloxacin. Maintain adequate hydration. Follow up if symptoms not improving, and as needed.

## 2016-08-27 ENCOUNTER — Other Ambulatory Visit: Payer: Self-pay

## 2016-08-27 MED ORDER — ESCITALOPRAM OXALATE 10 MG PO TABS: ORAL_TABLET | ORAL | 5 refills | Status: DC

## 2016-09-22 ENCOUNTER — Other Ambulatory Visit: Payer: Self-pay

## 2016-09-22 MED ORDER — ESCITALOPRAM OXALATE 10 MG PO TABS: 10.0000 mg | ORAL_TABLET | Freq: Every day | ORAL | 1 refills | Status: DC

## 2016-10-15 ENCOUNTER — Other Ambulatory Visit: Payer: Self-pay | Admitting: Obstetrics and Gynecology

## 2016-10-15 DIAGNOSIS — R928 Other abnormal and inconclusive findings on diagnostic imaging of breast: Secondary | ICD-10-CM

## 2016-10-26 ENCOUNTER — Other Ambulatory Visit: Payer: Self-pay | Admitting: Obstetrics and Gynecology

## 2016-10-26 ENCOUNTER — Ambulatory Visit
Admission: RE | Admit: 2016-10-26 | Discharge: 2016-10-26 | Disposition: A | Discharge status: Home / Self Care | Payer: BLUE CROSS/BLUE SHIELD | Source: Ambulatory Visit | Attending: Obstetrics and Gynecology | Admitting: Obstetrics and Gynecology

## 2016-10-26 DIAGNOSIS — R928 Other abnormal and inconclusive findings on diagnostic imaging of breast: Secondary | ICD-10-CM

## 2016-10-26 DIAGNOSIS — N6320 Unspecified lump in the left breast, unspecified quadrant: Secondary | ICD-10-CM

## 2016-10-28 ENCOUNTER — Ambulatory Visit
Admission: RE | Admit: 2016-10-28 | Discharge: 2016-10-28 | Disposition: A | Discharge status: Home / Self Care | Payer: BLUE CROSS/BLUE SHIELD | Source: Ambulatory Visit | Attending: Obstetrics and Gynecology | Admitting: Obstetrics and Gynecology

## 2016-10-28 ENCOUNTER — Other Ambulatory Visit: Payer: Self-pay | Admitting: Obstetrics and Gynecology

## 2016-10-28 DIAGNOSIS — N6320 Unspecified lump in the left breast, unspecified quadrant: Secondary | ICD-10-CM

## 2016-11-14 ENCOUNTER — Encounter: Payer: Self-pay | Admitting: Family Medicine

## 2016-11-15 ENCOUNTER — Other Ambulatory Visit: Payer: Self-pay | Admitting: Family Medicine

## 2016-11-15 MED ORDER — ESCITALOPRAM OXALATE 10 MG PO TABS: 10.0000 mg | ORAL_TABLET | Freq: Every day | ORAL | 1 refills | Status: DC

## 2017-05-02 ENCOUNTER — Other Ambulatory Visit: Payer: Self-pay | Admitting: Obstetrics and Gynecology

## 2017-05-02 DIAGNOSIS — N6320 Unspecified lump in the left breast, unspecified quadrant: Secondary | ICD-10-CM

## 2017-05-02 DIAGNOSIS — N6310 Unspecified lump in the right breast, unspecified quadrant: Secondary | ICD-10-CM

## 2017-05-09 ENCOUNTER — Other Ambulatory Visit: Payer: BLUE CROSS/BLUE SHIELD

## 2017-06-03 ENCOUNTER — Ambulatory Visit
Admission: RE | Admit: 2017-06-03 | Discharge: 2017-06-03 | Disposition: A | Discharge status: Home / Self Care | Payer: BLUE CROSS/BLUE SHIELD | Source: Ambulatory Visit | Attending: Obstetrics and Gynecology | Admitting: Obstetrics and Gynecology

## 2017-06-03 ENCOUNTER — Other Ambulatory Visit: Payer: Self-pay | Admitting: Obstetrics and Gynecology

## 2017-06-03 DIAGNOSIS — N6320 Unspecified lump in the left breast, unspecified quadrant: Secondary | ICD-10-CM

## 2017-06-03 DIAGNOSIS — N6310 Unspecified lump in the right breast, unspecified quadrant: Secondary | ICD-10-CM

## 2017-06-03 DIAGNOSIS — N63 Unspecified lump in unspecified breast: Secondary | ICD-10-CM

## 2017-07-18 ENCOUNTER — Other Ambulatory Visit: Payer: Self-pay | Admitting: Family Medicine

## 2017-07-18 ENCOUNTER — Telehealth: Payer: Self-pay | Admitting: Family Medicine

## 2017-07-18 DIAGNOSIS — M25551 Pain in right hip: Secondary | ICD-10-CM

## 2017-07-18 NOTE — Telephone Encounter (Signed)
Referral done to ortho/Dr. Tonita Cong

## 2017-07-18 NOTE — Telephone Encounter (Signed)
Ok to refer.

## 2017-07-18 NOTE — Telephone Encounter (Signed)
Relation to DD:UKGU Call back number:[phone]   Reason for call:  Patient requesting referral to orthopedic Johnn Hai, MD due to right hip pain, please advise

## 2017-08-01 ENCOUNTER — Ambulatory Visit: Payer: BLUE CROSS/BLUE SHIELD | Admitting: Physical Therapy

## 2017-09-09 ENCOUNTER — Other Ambulatory Visit: Payer: Self-pay | Admitting: Family Medicine

## 2017-09-09 MED ORDER — ESCITALOPRAM OXALATE 10 MG PO TABS: 10.0000 mg | ORAL_TABLET | Freq: Every day | ORAL | 1 refills | Status: DC

## 2017-09-09 NOTE — Telephone Encounter (Signed)
done

## 2017-09-09 NOTE — Telephone Encounter (Signed)
Relation to XB:OERQ Call back number:[phone] [id number] [place], [place] [phone] (Phone) [phone] (Fax)     Reason for call:  Patient requesting escitalopram (LEXAPRO) 10 MG tablet refill to hold her over until physical appointment for 12/22/2017 at [place], please advise

## 2017-11-14 ENCOUNTER — Encounter: Payer: BLUE CROSS/BLUE SHIELD | Admitting: Family Medicine

## 2017-11-17 ENCOUNTER — Ambulatory Visit: Payer: BLUE CROSS/BLUE SHIELD | Admitting: Family Medicine

## 2017-11-17 ENCOUNTER — Encounter: Payer: Self-pay | Admitting: Family Medicine

## 2017-11-17 VITALS — BP 98/60 | HR 75 | Temp 98.4°F | Resp 16 | Ht 65.0 in | Wt 127.6 lb

## 2017-11-17 DIAGNOSIS — F419 Anxiety disorder, unspecified: Secondary | ICD-10-CM

## 2017-11-17 MED ORDER — ESCITALOPRAM OXALATE 10 MG PO TABS: 10.0000 mg | ORAL_TABLET | Freq: Every day | ORAL | 3 refills | Status: DC

## 2017-11-17 NOTE — Patient Instructions (Signed)

## 2017-11-17 NOTE — Progress Notes (Signed)
Patient ID: Mckenzie Harris, female   DOB: 01/29/1976, 42 y.o.   MRN: [id number]    Subjective:  I acted as a Education administrator for Dr. Carollee Herter.  [name], [place]   Patient ID: Mckenzie Harris, female    DOB: Mar 04, 1976, 42 y.o.   MRN: [id number]  Chief Complaint  Patient presents with  . Anxiety    HPI  Patient is in today for follow up anxiety.  She is doing well on current treatment, Lexapro.  Patient Care Team: Carollee Herter, Alferd Apa, DO as PCP - General   Past Medical History:  Diagnosis Date  . Anxiety   . HSV-2 infection     Past Surgical History:  Procedure Laterality Date  . BREAST BIOPSY      Family History  Problem Relation Age of Onset  . Heart disease Father 48       heart attack   . Hyperlipidemia Father   . Hypertension Father   . Atrial fibrillation Father        ablation  . Anxiety disorder Unknown     Social History   Socioeconomic History  . Marital status: Married    Spouse name: Not on file  . Number of children: Not on file  . Years of education: Not on file  . Highest education level: Not on file  Social Needs  . Financial resource strain: Not on file  . Food insecurity - worry: Not on file  . Food insecurity - inability: Not on file  . Transportation needs - medical: Not on file  . Transportation needs - non-medical: Not on file  Occupational History  . Occupation: Print production planner  Tobacco Use  . Smoking status: Former Smoker    Packs/day: 0.00    Years: 20.00    Pack years: 0.00  . Smokeless tobacco: Never Used  Substance and Sexual Activity  . Alcohol use: Yes  . Drug use: No  . Sexual activity: Not Currently  Other Topics Concern  . Not on file  Social History Narrative   Regular exercise: no   Caffeine use: diet coke daily    Outpatient Medications Prior to Visit  Medication Sig Dispense Refill  . ALPRAZolam (XANAX) 0.25 MG tablet Take 1 tablet (0.25 mg total) by mouth at bedtime as needed for anxiety or sleep. 20 tablet 0  .  escitalopram (LEXAPRO) 10 MG tablet Take 1 tablet (10 mg total) daily by mouth. 90 tablet 1  . ciprofloxacin (CIPRO) 500 MG tablet Take 1 tablet (500 mg total) by mouth 2 (two) times daily. 20 tablet 0  . ibuprofen (ADVIL,MOTRIN) 400 MG tablet Take 400 mg by mouth every 6 (six) hours as needed for mild pain.    . nitrofurantoin, macrocrystal-monohydrate, (MACROBID) 100 MG capsule Take 1 capsule (100 mg total) by mouth 2 (two) times daily. 14 capsule 0  . phenazopyridine (PYRIDIUM) 200 MG tablet Take 1 tablet (200 mg total) by mouth 3 (three) times daily as needed for pain. 6 tablet 0  . scopolamine (TRANSDERM-SCOP, 1.5 MG,) 1 MG/3DAYS Place 1 patch (1.5 mg total) onto the skin every 3 (three) days. 4 patch 0   No facility-administered medications prior to visit.     No Known Allergies  Review of Systems  Constitutional: Negative for fever and malaise/fatigue.  HENT: Negative for congestion.   Eyes: Negative for blurred vision.  Respiratory: Negative for cough and shortness of breath.   Cardiovascular: Negative for chest pain, palpitations and leg swelling.  Gastrointestinal: Negative for vomiting.  Musculoskeletal: Negative for back pain.  Skin: Negative for rash.  Neurological: Negative for loss of consciousness and headaches.       Objective:    Physical Exam  Constitutional: She is oriented to person, place, and time. She appears well-developed and well-nourished. No distress.  HENT:  Head: Normocephalic and atraumatic.  Nose: Nose normal.  Eyes: Right eye exhibits no discharge. Left eye exhibits no discharge.  Neck: Normal range of motion. Neck supple.  Cardiovascular: Normal rate and regular rhythm.  No murmur heard. Pulmonary/Chest: Effort normal and breath sounds normal.  Abdominal: Soft. Bowel sounds are normal. There is no tenderness.  Musculoskeletal: She exhibits no edema.  Neurological: She is alert and oriented to person, place, and time.  Skin: Skin is warm and  dry.  Psychiatric: She has a normal mood and affect.  Nursing note and vitals reviewed.   BP 98/60 (BP Location: Right Arm, Cuff Size: Normal)   Pulse 75   Temp 98.4 F (36.9 C) (Oral)   Resp 16   Ht 5\' 5"  (1.651 m)   Wt 127 lb 9.6 oz (57.9 kg)   LMP 11/04/2017   SpO2 98%   BMI 21.23 kg/m  Wt Readings from Last 3 Encounters:  11/17/17 127 lb 9.6 oz (57.9 kg)  06/15/16 118 lb 9.6 oz (53.8 kg)  02/14/16 111 lb 8 oz (50.6 kg)   BP Readings from Last 3 Encounters:  11/17/17 98/60  06/15/16 100/70  02/14/16 106/56     Immunization History  Administered Date(s) Administered  . Td 10/21/2008    Health Maintenance  Topic Date Due  . PAP SMEAR  08/25/2018  . TETANUS/TDAP  10/21/2018  . INFLUENZA VACCINE  Completed  . HIV Screening  Completed    Lab Results  Component Value Date   WBC 11.8 (H) 02/14/2016   HGB 12.5 02/14/2016   HCT 35.7 (L) 02/14/2016   PLT 214 02/14/2016   GLUCOSE 102 (H) 02/14/2016   CHOL 149 11/14/2015   TRIG 76.0 11/14/2015   HDL 51.70 11/14/2015   LDLCALC 82 11/14/2015   ALT 13 11/14/2015   AST 14 11/14/2015   NA 140 02/14/2016   K 3.8 02/14/2016   CL 103 02/14/2016   CREATININE 0.71 02/14/2016   BUN 9 02/14/2016   CO2 25 02/14/2016   TSH 1.67 11/14/2015    Lab Results  Component Value Date   TSH 1.67 11/14/2015   Lab Results  Component Value Date   WBC 11.8 (H) 02/14/2016   HGB 12.5 02/14/2016   HCT 35.7 (L) 02/14/2016   MCV 82.8 02/14/2016   PLT 214 02/14/2016   Lab Results  Component Value Date   NA 140 02/14/2016   K 3.8 02/14/2016   CO2 25 02/14/2016   GLUCOSE 102 (H) 02/14/2016   BUN 9 02/14/2016   CREATININE 0.71 02/14/2016   BILITOT 0.9 11/14/2015   ALKPHOS 60 11/14/2015   AST 14 11/14/2015   ALT 13 11/14/2015   PROT 7.2 11/14/2015   ALBUMIN 4.2 11/14/2015   CALCIUM 8.8 (L) 02/14/2016   ANIONGAP 12 02/14/2016   GFR 87.54 11/14/2015   Lab Results  Component Value Date   CHOL 149 11/14/2015   Lab Results    Component Value Date   HDL 51.70 11/14/2015   Lab Results  Component Value Date   LDLCALC 82 11/14/2015   Lab Results  Component Value Date   TRIG 76.0 11/14/2015   Lab Results  Component Value Date   CHOLHDL  3 11/14/2015   No results found for: HGBA1C       Assessment & Plan:   Problem List Items Addressed This Visit    None    Visit Diagnoses    Anxiety    -  Primary   Relevant Medications   escitalopram (LEXAPRO) 10 MG tablet    stable Con'[t meds Pt is going through process to be a kidney donor for her brother who has goodpastures   I have discontinued Linna Christofferson's nitrofurantoin (macrocrystal-monohydrate), phenazopyridine, ibuprofen, ciprofloxacin, and scopolamine. I have also changed her escitalopram. Additionally, I am having her maintain her ALPRAZolam.  Meds ordered this encounter  Medications  . escitalopram (LEXAPRO) 10 MG tablet    Sig: Take 1 tablet (10 mg total) by mouth daily.    Dispense:  90 tablet    Refill:  3    CMA served as scribe during this visit. History, Physical and Plan performed by medical provider. Documentation and orders reviewed and attested to.  Ann Held, DO

## 2017-12-05 ENCOUNTER — Other Ambulatory Visit: Payer: Self-pay | Admitting: Obstetrics and Gynecology

## 2017-12-05 ENCOUNTER — Ambulatory Visit
Admission: RE | Admit: 2017-12-05 | Discharge: 2017-12-05 | Disposition: A | Discharge status: Home / Self Care | Payer: BLUE CROSS/BLUE SHIELD | Source: Ambulatory Visit | Attending: Obstetrics and Gynecology | Admitting: Obstetrics and Gynecology

## 2017-12-05 DIAGNOSIS — N63 Unspecified lump in unspecified breast: Secondary | ICD-10-CM

## 2017-12-05 DIAGNOSIS — N6320 Unspecified lump in the left breast, unspecified quadrant: Secondary | ICD-10-CM

## 2017-12-09 ENCOUNTER — Other Ambulatory Visit: Payer: BLUE CROSS/BLUE SHIELD

## 2017-12-15 ENCOUNTER — Other Ambulatory Visit: Payer: Self-pay | Admitting: Obstetrics and Gynecology

## 2017-12-15 DIAGNOSIS — N6320 Unspecified lump in the left breast, unspecified quadrant: Secondary | ICD-10-CM

## 2017-12-16 ENCOUNTER — Ambulatory Visit
Admission: RE | Admit: 2017-12-16 | Discharge: 2017-12-16 | Disposition: A | Discharge status: Home / Self Care | Payer: BLUE CROSS/BLUE SHIELD | Source: Ambulatory Visit | Attending: Obstetrics and Gynecology | Admitting: Obstetrics and Gynecology

## 2017-12-16 DIAGNOSIS — N6320 Unspecified lump in the left breast, unspecified quadrant: Secondary | ICD-10-CM

## 2017-12-22 ENCOUNTER — Encounter: Payer: BLUE CROSS/BLUE SHIELD | Admitting: Family Medicine

## 2018-05-09 ENCOUNTER — Ambulatory Visit: Payer: BLUE CROSS/BLUE SHIELD | Admitting: Family Medicine

## 2018-05-09 VITALS — BP 90/49 | HR 67 | Temp 99.3°F | Resp 16 | Ht 65.0 in | Wt 122.4 lb

## 2018-05-09 DIAGNOSIS — F439 Reaction to severe stress, unspecified: Secondary | ICD-10-CM | POA: Diagnosis not present

## 2018-05-09 DIAGNOSIS — R519 Headache, unspecified: Secondary | ICD-10-CM

## 2018-05-09 DIAGNOSIS — R51 Headache: Secondary | ICD-10-CM | POA: Diagnosis not present

## 2018-05-09 DIAGNOSIS — G8929 Other chronic pain: Secondary | ICD-10-CM

## 2018-05-09 MED ORDER — CYCLOBENZAPRINE HCL 5 MG PO TABS: 5.0000 mg | ORAL_TABLET | Freq: Three times a day (TID) | ORAL | 1 refills | Status: DC | PRN

## 2018-05-09 MED ORDER — RIZATRIPTAN BENZOATE 5 MG PO TABS: 5.0000 mg | ORAL_TABLET | ORAL | 0 refills | Status: DC | PRN

## 2018-05-09 NOTE — Progress Notes (Signed)
Patient ID: Mckenzie Harris, female   DOB: 04-08-1976, 42 y.o.   MRN: [id number]     Subjective:  I acted as a Education administrator for Dr. Carollee Harris.  Mckenzie Harris, [place]   Patient ID: Mckenzie Harris, female    DOB: Feb 17, 1976, 42 y.o.   MRN: [id number]  Chief Complaint  Patient presents with  . Migraine    HPI  Patient is in today for migraine.  She has had them on and off for about 6 months to a year.  Has taken ibuprofen and Tylenol for the pain when she has a migraine.  Pt states she gets them a few times a month.  They have worsened .  Tylenol and IB used to work but now does not.  She can go to sleep and usually its better when she gets up but not always.  Vision is not affected.  Pain is on top or back of head.    Patient Care Team: Mckenzie Harris, Mckenzie Apa, DO as PCP - General   Past Medical History:  Diagnosis Date  . Anxiety   . HSV-2 infection     Past Surgical History:  Procedure Laterality Date  . BREAST BIOPSY      Family History  Problem Relation Age of Onset  . Heart disease Father 15       heart attack   . Hyperlipidemia Father   . Hypertension Father   . Atrial fibrillation Father        ablation  . Anxiety disorder Unknown     Social History   Socioeconomic History  . Marital status: Married    Spouse name: Not on file  . Number of children: Not on file  . Years of education: Not on file  . Highest education level: Not on file  Occupational History  . Occupation: Print production planner  Social Needs  . Financial resource strain: Not on file  . Food insecurity:    Worry: Not on file    Inability: Not on file  . Transportation needs:    Medical: Not on file    Non-medical: Not on file  Tobacco Use  . Smoking status: Former Smoker    Packs/day: 0.00    Years: 20.00    Pack years: 0.00  . Smokeless tobacco: Never Used  Substance and Sexual Activity  . Alcohol use: Yes  . Drug use: No  . Sexual activity: Not Currently  Lifestyle  . Physical activity:    Days per  week: Not on file    Minutes per session: Not on file  . Stress: Not on file  Relationships  . Social connections:    Talks on phone: Not on file    Gets together: Not on file    Attends religious service: Not on file    Active member of club or organization: Not on file    Attends meetings of clubs or organizations: Not on file    Relationship status: Not on file  . Intimate partner violence:    Fear of current or ex partner: Not on file    Emotionally abused: Not on file    Physically abused: Not on file    Forced sexual activity: Not on file  Other Topics Concern  . Not on file  Social History Narrative   Regular exercise: no   Caffeine use: diet coke daily    Outpatient Medications Prior to Visit  Medication Sig Dispense Refill  . ALPRAZolam (XANAX) 0.25 MG tablet Take 1 tablet (0.25  mg total) by mouth at bedtime as needed for anxiety or sleep. 20 tablet 0  . escitalopram (LEXAPRO) 10 MG tablet Take 1 tablet (10 mg total) by mouth daily. 90 tablet 3   No facility-administered medications prior to visit.     No Known Allergies  Review of Systems  Constitutional: Negative for fever and malaise/fatigue.  HENT: Negative for congestion.   Eyes: Negative for blurred vision.  Respiratory: Negative for cough and shortness of breath.   Cardiovascular: Negative for chest pain, palpitations and leg swelling.  Gastrointestinal: Negative for vomiting.  Musculoskeletal: Negative for back pain.  Skin: Negative for rash.  Neurological: Positive for headaches. Negative for loss of consciousness.       Objective:    Physical Exam  Constitutional: She is oriented to person, place, and time. She appears well-developed and well-nourished.  HENT:  Head: Normocephalic and atraumatic.  Eyes: Conjunctivae and EOM are normal.  Neck: Normal range of motion. Neck supple. No JVD present. Carotid bruit is not present. No thyromegaly present.  Cardiovascular: Normal rate, regular rhythm  and normal heart sounds.  No murmur heard. Pulmonary/Chest: Effort normal and breath sounds normal. No respiratory distress. She has no wheezes. She has no rales. She exhibits no tenderness.  Musculoskeletal: She exhibits no edema.  Neurological: She is alert and oriented to person, place, and time.  Psychiatric: She has a normal mood and affect.  Nursing note and vitals reviewed.   BP (!) 90/49 (BP Location: Right Arm, Cuff Size: Normal)   Pulse 67   Temp 99.3 F (37.4 C) (Oral)   Resp 16   Ht 5\' 5"  (1.651 m)   Wt 122 lb 6.4 oz (55.5 kg)   SpO2 100%   BMI 20.37 kg/m  Wt Readings from Last 3 Encounters:  05/09/18 122 lb 6.4 oz (55.5 kg)  11/17/17 127 lb 9.6 oz (57.9 kg)  06/15/16 118 lb 9.6 oz (53.8 kg)   BP Readings from Last 3 Encounters:  05/09/18 (!) 90/49  11/17/17 98/60  06/15/16 100/70     Immunization History  Administered Date(s) Administered  . Td 10/21/2008    Health Maintenance  Topic Date Due  . INFLUENZA VACCINE  05/25/2018  . PAP SMEAR  08/25/2018  . TETANUS/TDAP  10/21/2018  . HIV Screening  Completed    Lab Results  Component Value Date   WBC 7.6 05/09/2018   HGB 13.4 05/09/2018   HCT 38.5 05/09/2018   PLT 250.0 05/09/2018   GLUCOSE 92 05/09/2018   CHOL 149 11/14/2015   TRIG 76.0 11/14/2015   HDL 51.70 11/14/2015   LDLCALC 82 11/14/2015   ALT 10 05/09/2018   AST 13 05/09/2018   NA 139 05/09/2018   K 3.9 05/09/2018   CL 102 05/09/2018   CREATININE 0.81 05/09/2018   BUN 20 05/09/2018   CO2 31 05/09/2018   TSH 1.67 11/14/2015    Lab Results  Component Value Date   TSH 1.67 11/14/2015   Lab Results  Component Value Date   WBC 7.6 05/09/2018   HGB 13.4 05/09/2018   HCT 38.5 05/09/2018   MCV 87.2 05/09/2018   PLT 250.0 05/09/2018   Lab Results  Component Value Date   NA 139 05/09/2018   K 3.9 05/09/2018   CO2 31 05/09/2018   GLUCOSE 92 05/09/2018   BUN 20 05/09/2018   CREATININE 0.81 05/09/2018   BILITOT 0.5 05/09/2018    ALKPHOS 50 05/09/2018   AST 13 05/09/2018   ALT 10  05/09/2018   PROT 6.8 05/09/2018   ALBUMIN 4.4 05/09/2018   CALCIUM 9.1 05/09/2018   ANIONGAP 12 02/14/2016   GFR 81.60 05/09/2018   Lab Results  Component Value Date   CHOL 149 11/14/2015   Lab Results  Component Value Date   HDL 51.70 11/14/2015   Lab Results  Component Value Date   LDLCALC 82 11/14/2015   Lab Results  Component Value Date   TRIG 76.0 11/14/2015   Lab Results  Component Value Date   CHOLHDL 3 11/14/2015   No results found for: HGBA1C       Assessment & Plan:   Problem List Items Addressed This Visit    None    Visit Diagnoses    Chronic nonintractable headache, unspecified headache type    -  Primary   Relevant Medications   rizatriptan (MAXALT) 5 MG tablet   Other Relevant Orders   CBC with Differential/Platelet (Completed)   Comprehensive metabolic panel (Completed)   Sedimentation rate (Completed)   Stress at home          I have discontinued Edgar Nepomuceno's cyclobenzaprine. I am also having her start on rizatriptan. Additionally, I am having her maintain her ALPRAZolam and escitalopram.  Meds ordered this encounter  Medications  . DISCONTD: cyclobenzaprine (FLEXERIL) 5 MG tablet    Sig: Take 1 tablet (5 mg total) by mouth 3 (three) times daily as needed for muscle spasms.    Dispense:  30 tablet    Refill:  1  . rizatriptan (MAXALT) 5 MG tablet    Sig: Take 1 tablet (5 mg total) by mouth as needed for migraine. May repeat in 2 hours if needed    Dispense:  10 tablet    Refill:  0    CMA served as Education administrator during this visit. History, Physical and Plan performed by medical provider. Documentation and orders reviewed and attested to.  Ann Held, DO

## 2018-05-09 NOTE — Patient Instructions (Signed)
Migraine Headache A migraine headache is an intense, throbbing pain on one side or both sides of the head. Migraines may also cause other symptoms, such as nausea, vomiting, and sensitivity to light and noise. What are the causes? Doing or taking certain things may also trigger migraines, such as:  Alcohol.  Smoking.  Medicines, such as: ? Medicine used to treat chest pain (nitroglycerine). ? Birth control pills. ? Estrogen pills. ? Certain blood pressure medicines.  Aged cheeses, chocolate, or caffeine.  Foods or drinks that contain nitrates, glutamate, aspartame, or tyramine.  Physical activity.  Other things that may trigger a migraine include:  Menstruation.  Pregnancy.  Hunger.  Stress, lack of sleep, too much sleep, or fatigue.  Weather changes.  What increases the risk? The following factors may make you more likely to experience migraine headaches:  Age. Risk increases with age.  Family history of migraine headaches.  Being Caucasian.  Depression and anxiety.  Obesity.  Being a woman.  Having a hole in the heart (patent foramen ovale) or other heart problems.  What are the signs or symptoms? The main symptom of this condition is pulsating or throbbing pain. Pain may:  Happen in any area of the head, such as on one side or both sides.  Interfere with daily activities.  Get worse with physical activity.  Get worse with exposure to bright lights or loud noises.  Other symptoms may include:  Nausea.  Vomiting.  Dizziness.  General sensitivity to bright lights, loud noises, or smells.  Before you get a migraine, you may get warning signs that a migraine is developing (aura). An aura may include:  Seeing flashing lights or having blind spots.  Seeing bright spots, halos, or zigzag lines.  Having tunnel vision or blurred vision.  Having numbness or a tingling feeling.  Having trouble talking.  Having muscle weakness.  How is this  diagnosed? A migraine headache can be diagnosed based on:  Your symptoms.  A physical exam.  Tests, such as CT scan or MRI of the head. These imaging tests can help rule out other causes of headaches.  Taking fluid from the spine (lumbar puncture) and analyzing it (cerebrospinal fluid analysis, or CSF analysis).  How is this treated? A migraine headache is usually treated with medicines that:  Relieve pain.  Relieve nausea.  Prevent migraines from coming back.  Treatment may also include:  Acupuncture.  Lifestyle changes like avoiding foods that trigger migraines.  Follow these instructions at home: Medicines  Take over-the-counter and prescription medicines only as told by your health care provider.  Do not drive or use heavy machinery while taking prescription pain medicine.  To prevent or treat constipation while you are taking prescription pain medicine, your health care provider may recommend that you: ? Drink enough fluid to keep your urine clear or pale yellow. ? Take over-the-counter or prescription medicines. ? Eat foods that are high in fiber, such as fresh fruits and vegetables, whole grains, and beans. ? Limit foods that are high in fat and processed sugars, such as fried and sweet foods. Lifestyle  Avoid alcohol use.  Do not use any products that contain nicotine or tobacco, such as cigarettes and e-cigarettes. If you need help quitting, ask your health care provider.  Get at least 8 hours of sleep every night.  Limit your stress. General instructions   Keep a journal to find out what may trigger your migraine headaches. For example, write down: ? What you eat and   drink. ? How much sleep you get. ? Any change to your diet or medicines.  If you have a migraine: ? Avoid things that make your symptoms worse, such as bright lights. ? It may help to lie down in a dark, quiet room. ? Do not drive or use heavy machinery. ? Ask your health care provider  what activities are safe for you while you are experiencing symptoms.  Keep all follow-up visits as told by your health care provider. This is important. Contact a health care provider if:  You develop symptoms that are different or more severe than your usual migraine symptoms. Get help right away if:  Your migraine becomes severe.  You have a fever.  You have a stiff neck.  You have vision loss.  Your muscles feel weak or like you cannot control them.  You start to lose your balance often.  You develop trouble walking.  You faint. This information is not intended to replace advice given to you by your health care provider. Make sure you discuss any questions you have with your health care provider. Document Released: 10/11/2005 Document Revised: 04/30/2016 Document Reviewed: 03/29/2016 Elsevier Interactive Patient Education  2017 [employer]   

## 2018-05-10 ENCOUNTER — Encounter: Payer: Self-pay | Admitting: Family Medicine

## 2018-05-10 LAB — CBC WITH DIFFERENTIAL/PLATELET
BASOS PCT: 1.3 % (ref 0.0–3.0)
Basophils Absolute: 0.1 10*3/uL (ref 0.0–0.1)
EOS ABS: 0.1 10*3/uL (ref 0.0–0.7)
Eosinophils Relative: 1 % (ref 0.0–5.0)
HEMATOCRIT: 38.5 % (ref 36.0–46.0)
Hemoglobin: 13.4 g/dL (ref 12.0–15.0)
LYMPHS PCT: 27.2 % (ref 12.0–46.0)
Lymphs Abs: 2.1 10*3/uL (ref 0.7–4.0)
MCHC: 34.7 g/dL (ref 30.0–36.0)
MCV: 87.2 fl (ref 78.0–100.0)
MONO ABS: 0.5 10*3/uL (ref 0.1–1.0)
Monocytes Relative: 6.6 % (ref 3.0–12.0)
Neutro Abs: 4.8 10*3/uL (ref 1.4–7.7)
Neutrophils Relative %: 63.9 % (ref 43.0–77.0)
PLATELETS: 250 10*3/uL (ref 150.0–400.0)
RBC: 4.41 Mil/uL (ref 3.87–5.11)
RDW: 13.8 % (ref 11.5–15.5)
WBC: 7.6 10*3/uL (ref 4.0–10.5)

## 2018-05-10 LAB — COMPREHENSIVE METABOLIC PANEL
ALT: 10 U/L (ref 0–35)
AST: 13 U/L (ref 0–37)
Albumin: 4.4 g/dL (ref 3.5–5.2)
Alkaline Phosphatase: 50 U/L (ref 39–117)
BUN: 20 mg/dL (ref 6–23)
CALCIUM: 9.1 mg/dL (ref 8.4–10.5)
CHLORIDE: 102 meq/L (ref 96–112)
CO2: 31 meq/L (ref 19–32)
CREATININE: 0.81 mg/dL (ref 0.40–1.20)
GFR: 81.6 mL/min (ref >60.00)
Glucose, Bld: 92 mg/dL (ref 70–99)
Potassium: 3.9 mEq/L (ref 3.5–5.1)
Sodium: 139 mEq/L (ref 135–145)
Total Bilirubin: 0.5 mg/dL (ref 0.2–1.2)
Total Protein: 6.8 g/dL (ref 6.0–8.3)

## 2018-05-10 LAB — SEDIMENTATION RATE: Sed Rate: 1 mm/hr (ref 0–20)

## 2018-06-05 ENCOUNTER — Ambulatory Visit
Admission: RE | Admit: 2018-06-05 | Discharge: 2018-06-05 | Disposition: A | Discharge status: Home / Self Care | Payer: BLUE CROSS/BLUE SHIELD | Source: Ambulatory Visit | Attending: Obstetrics and Gynecology | Admitting: Obstetrics and Gynecology

## 2018-06-05 ENCOUNTER — Other Ambulatory Visit: Payer: Self-pay | Admitting: Obstetrics and Gynecology

## 2018-06-05 DIAGNOSIS — N63 Unspecified lump in unspecified breast: Secondary | ICD-10-CM

## 2018-10-05 ENCOUNTER — Other Ambulatory Visit: Payer: Self-pay | Admitting: Obstetrics and Gynecology

## 2018-10-05 ENCOUNTER — Other Ambulatory Visit: Payer: Self-pay | Admitting: Family Medicine

## 2018-10-05 DIAGNOSIS — N63 Unspecified lump in unspecified breast: Secondary | ICD-10-CM

## 2018-10-05 DIAGNOSIS — R519 Headache, unspecified: Secondary | ICD-10-CM

## 2018-10-05 DIAGNOSIS — G8929 Other chronic pain: Secondary | ICD-10-CM

## 2018-10-05 DIAGNOSIS — R51 Headache: Principal | ICD-10-CM

## 2018-10-05 MED ORDER — RIZATRIPTAN BENZOATE 5 MG PO TABS: 5.0000 mg | ORAL_TABLET | ORAL | 5 refills | Status: DC | PRN

## 2018-10-10 ENCOUNTER — Other Ambulatory Visit: Payer: Self-pay | Admitting: Obstetrics and Gynecology

## 2018-10-10 ENCOUNTER — Ambulatory Visit
Admission: RE | Admit: 2018-10-10 | Discharge: 2018-10-10 | Disposition: A | Discharge status: Home / Self Care | Payer: BLUE CROSS/BLUE SHIELD | Source: Ambulatory Visit | Attending: Obstetrics and Gynecology | Admitting: Obstetrics and Gynecology

## 2018-10-10 DIAGNOSIS — N63 Unspecified lump in unspecified breast: Secondary | ICD-10-CM

## 2018-10-16 ENCOUNTER — Other Ambulatory Visit: Payer: Self-pay | Admitting: Obstetrics and Gynecology

## 2018-10-16 ENCOUNTER — Ambulatory Visit
Admission: RE | Admit: 2018-10-16 | Discharge: 2018-10-16 | Disposition: A | Discharge status: Home / Self Care | Payer: BLUE CROSS/BLUE SHIELD | Source: Ambulatory Visit | Attending: Obstetrics and Gynecology | Admitting: Obstetrics and Gynecology

## 2018-10-16 DIAGNOSIS — N63 Unspecified lump in unspecified breast: Secondary | ICD-10-CM

## 2018-12-06 ENCOUNTER — Inpatient Hospital Stay: Admission: RE | Admit: 2018-12-06 | Payer: BLUE CROSS/BLUE SHIELD | Source: Ambulatory Visit

## 2018-12-06 ENCOUNTER — Other Ambulatory Visit: Payer: BLUE CROSS/BLUE SHIELD

## 2019-01-01 IMAGING — MG DIGITAL DIAGNOSTIC BILATERAL MAMMOGRAM WITH TOMO AND CAD
8 series · 8 of 24 positions shown · non-contrast
Comparison: Previous exam(s).

CLINICAL DATA: 42-year-old female status post left breast biopsy
demonstrating fibroadenomas. The patient is presenting for annual
bilateral mammography and follow-up of additional probably benign
masses.

EXAM:
DIGITAL DIAGNOSTIC BILATERAL MAMMOGRAM WITH CAD AND TOMO
ULTRASOUND BILATERAL BREAST

[L CC synth-2D]
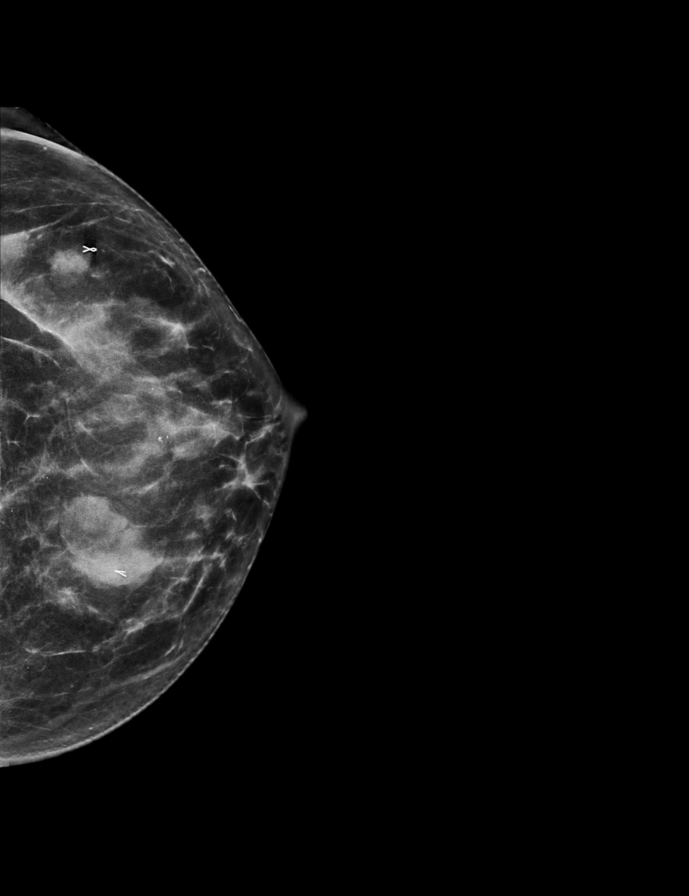

[R MLO synth-2D]
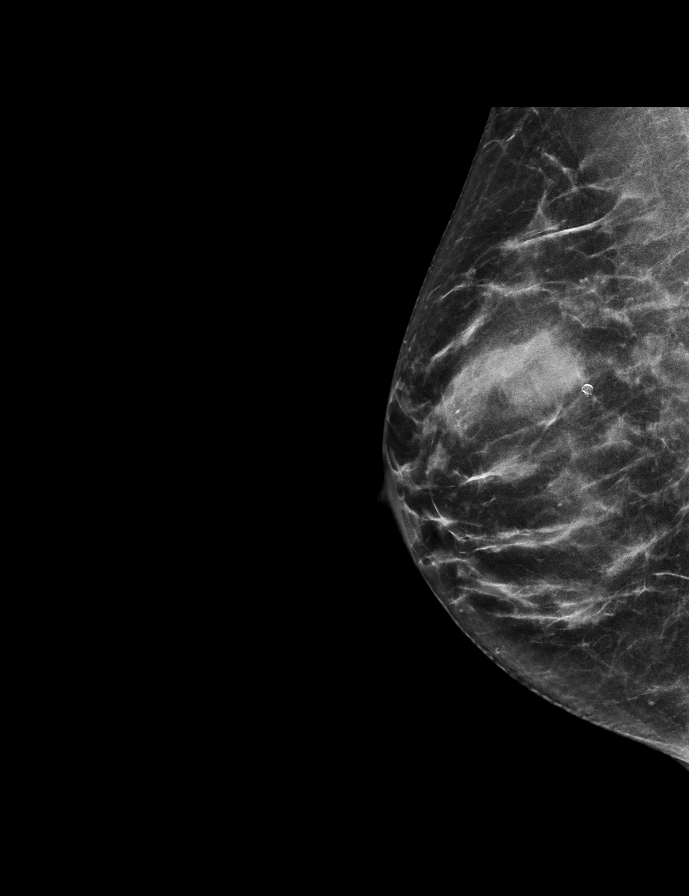

[R CC synth-2D]
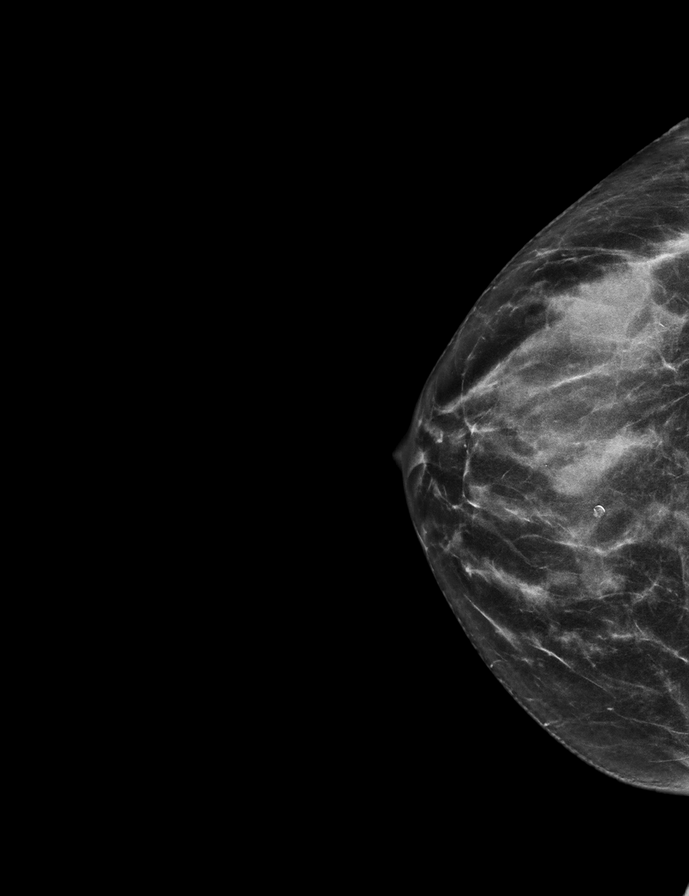

[L MLO synth-2D]
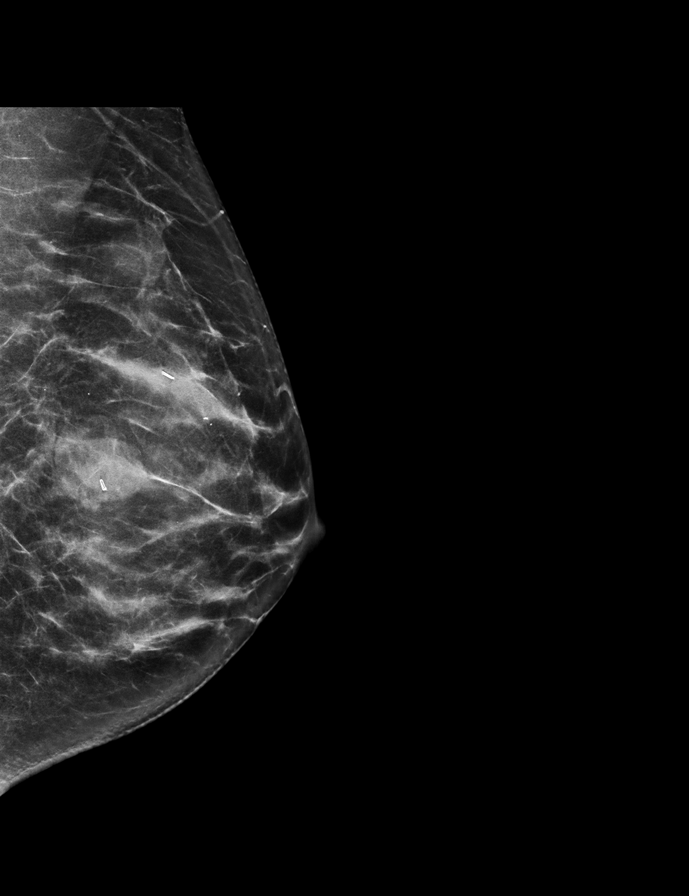

[R MLO tomo · tomo slice 29/56.0]
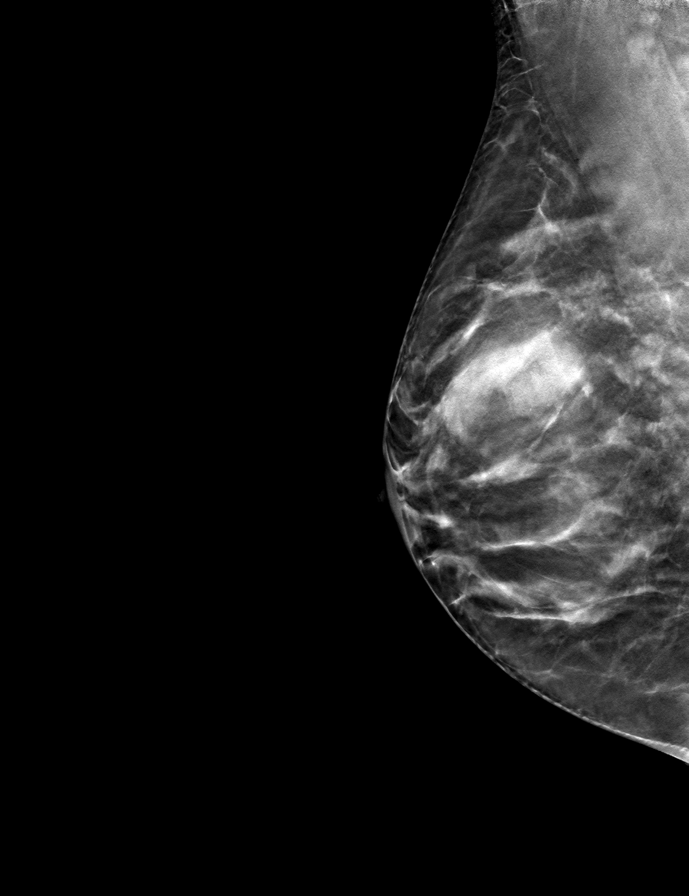

[R CC tomo · tomo slice 29/56.0]
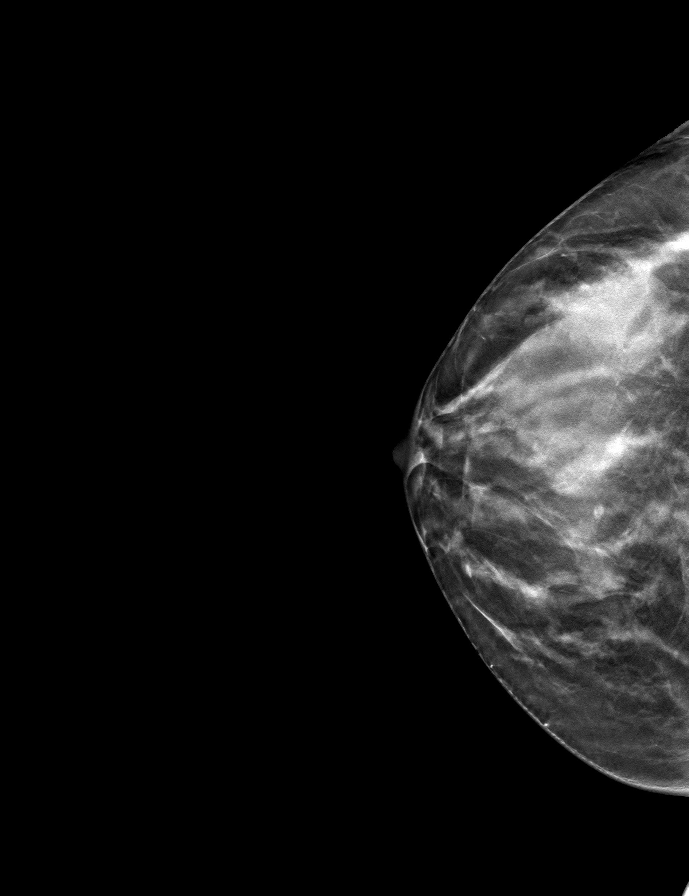

[L MLO tomo · tomo slice 31/61.0]
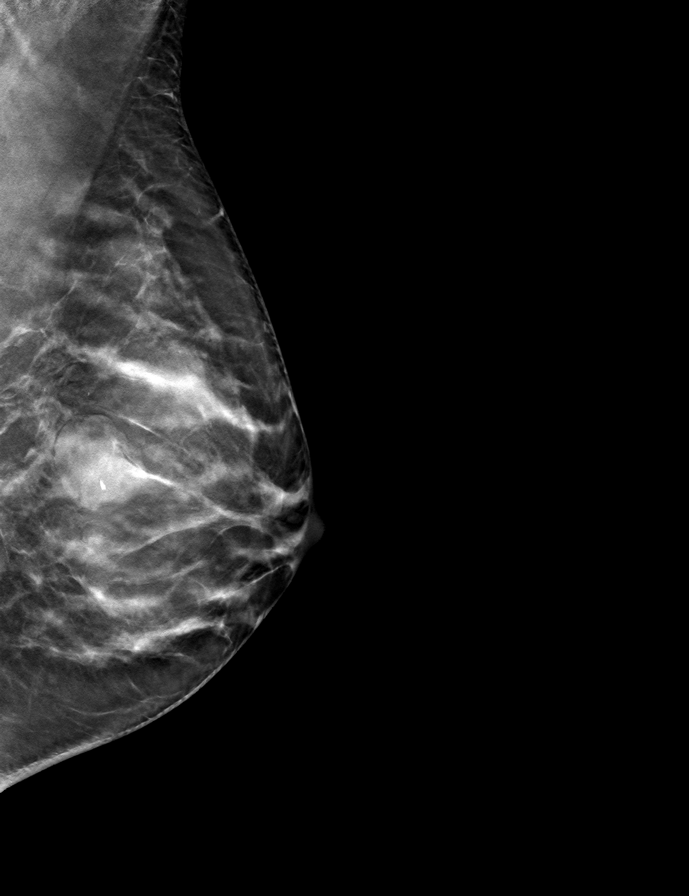

[L CC tomo · tomo slice 29/58.0]
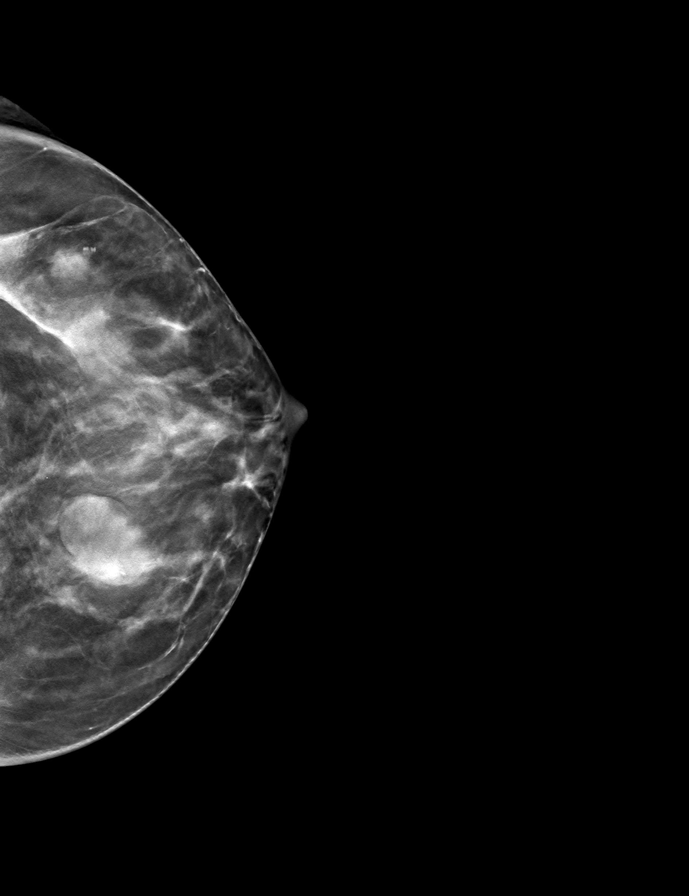

[8 of 24 positions shown; findings below may reference images not displayed]

ACR Breast Density Category c: The breast tissue is heterogeneously
dense, which may obscure small masses.
FINDINGS: Stable circumscribed equal density masses are identified in the
bilateral breasts. Post biopsy changes are identified on the left.
No new or suspicious findings are identified in either breast.

Mammographic images were processed with CAD.

Targeted ultrasound is performed, showing stable appearance of an
oval, circumscribed hypoechoic mass at the 1 o'clock position 2 cm
from the nipple on the right. It measures 1.2 x 1.1 x 0.6 cm. Within
the left breast. A stable, gently lobulated mass at the [DATE]
position 3 cm from the nipple measures 0.9 x 0.8 x 0.6 cm.
IMPRESSION: 1. Stable, probably benign probable fibroadenomas in the bilateral
breasts. Recommendation is for a final ultrasound follow-up in 6
months to a demonstrate 2 year stability with anticipation of
returning to annual screening mammography in May 2019.
2. No mammographic evidence of malignancy in either breast.

RECOMMENDATION:
Bilateral ultrasound in 6 months.

I have discussed the findings and recommendations with the patient.
Results were also provided in writing at the conclusion of the
visit. If applicable, a reminder letter will be sent to the patient
regarding the next appointment.

BI-RADS CATEGORY  3: Probably benign.

## 2019-01-01 IMAGING — US ULTRASOUND RIGHT BREAST LIMITED
1 series · 5 of 5 positions shown · non-contrast
Comparison: Previous exam(s).

CLINICAL DATA: 42-year-old female status post left breast biopsy
demonstrating fibroadenomas. The patient is presenting for annual
bilateral mammography and follow-up of additional probably benign
masses.

EXAM:
DIGITAL DIAGNOSTIC BILATERAL MAMMOGRAM WITH CAD AND TOMO
ULTRASOUND BILATERAL BREAST

[Series 1: ultrasound right breast limited · 0.06mm/px · 5 of 5 slices shown]
[im 1/5]
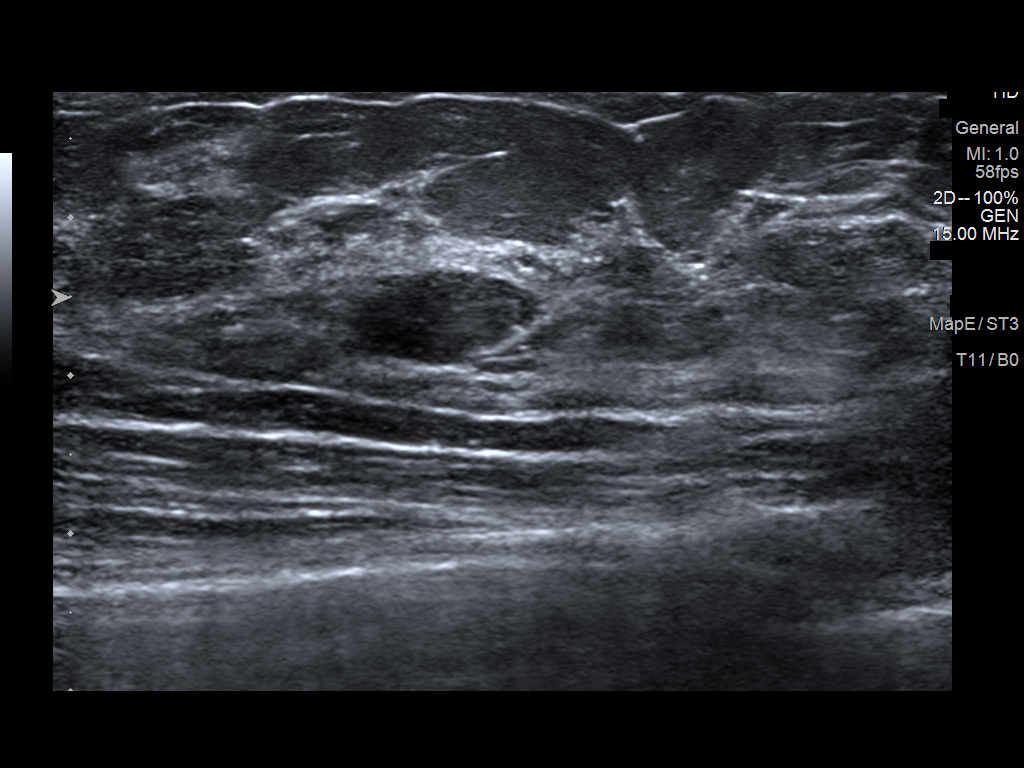
[im 2/5]
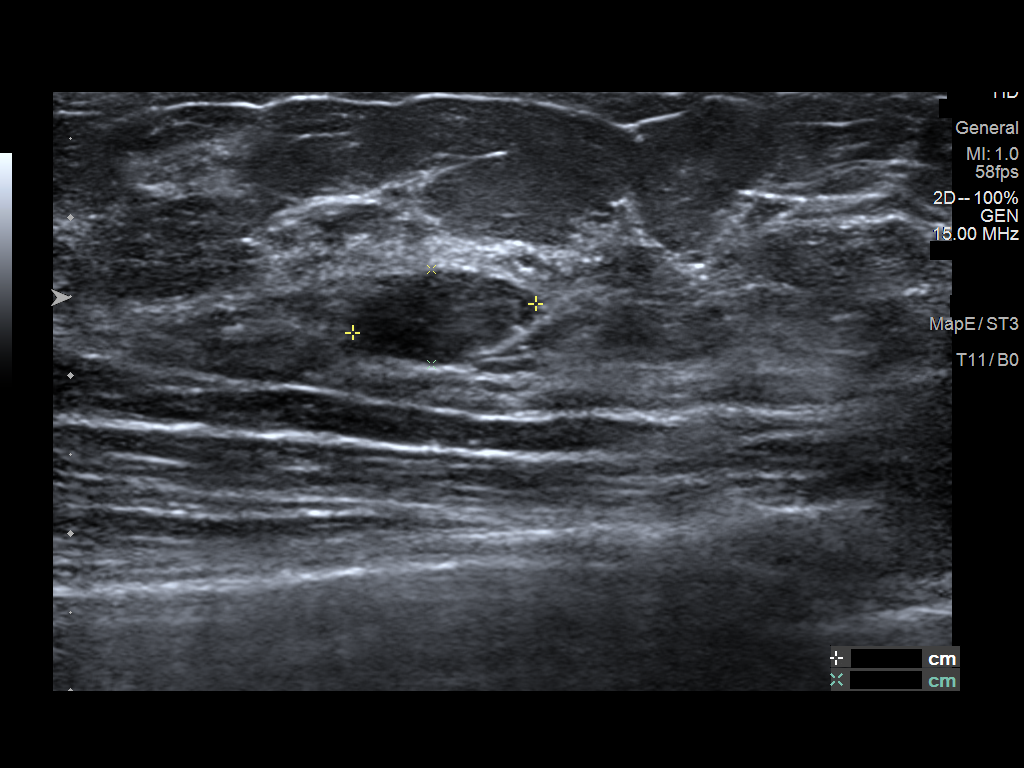
[im 3/5]
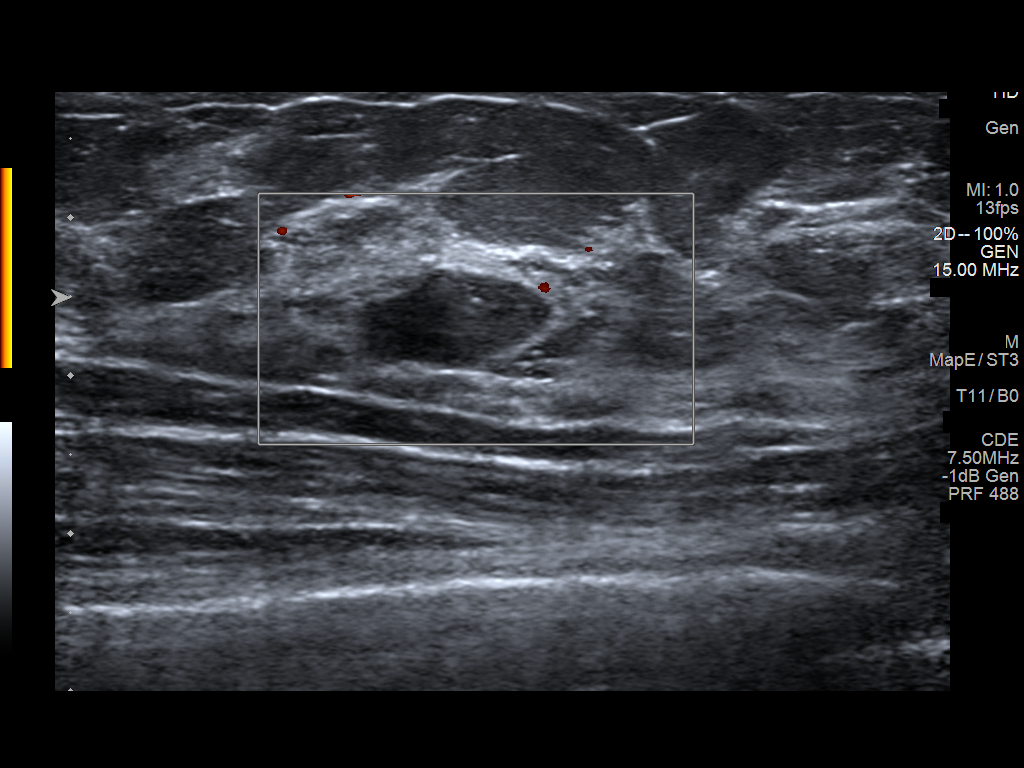
[im 4/5]
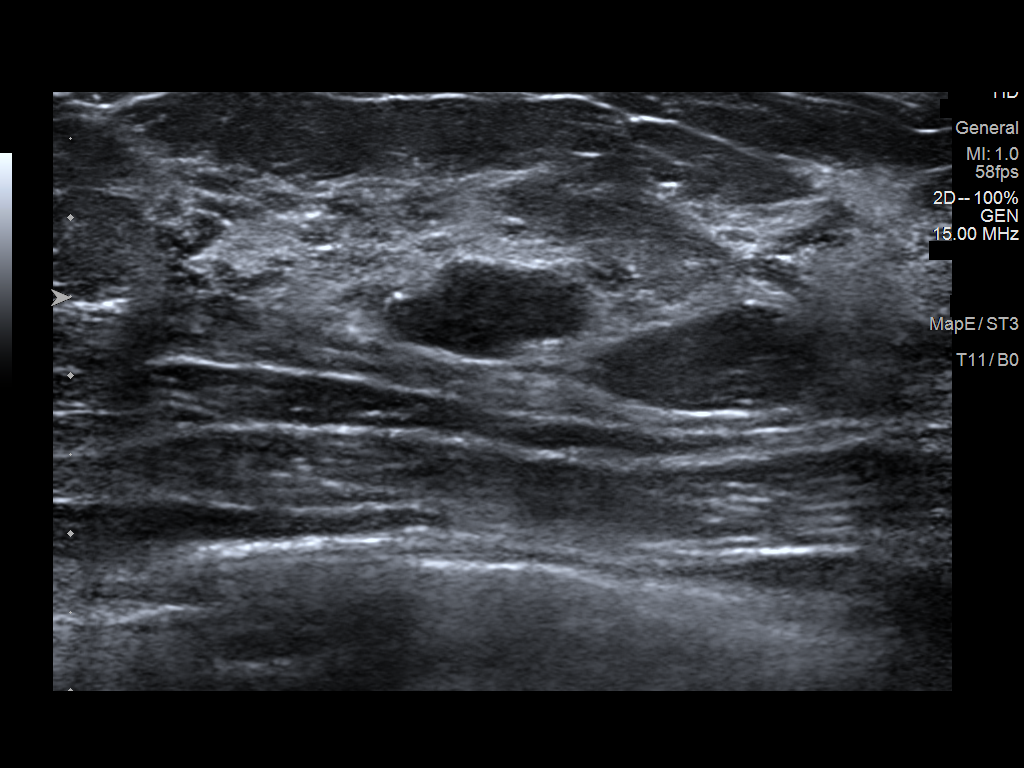
[im 5/5]
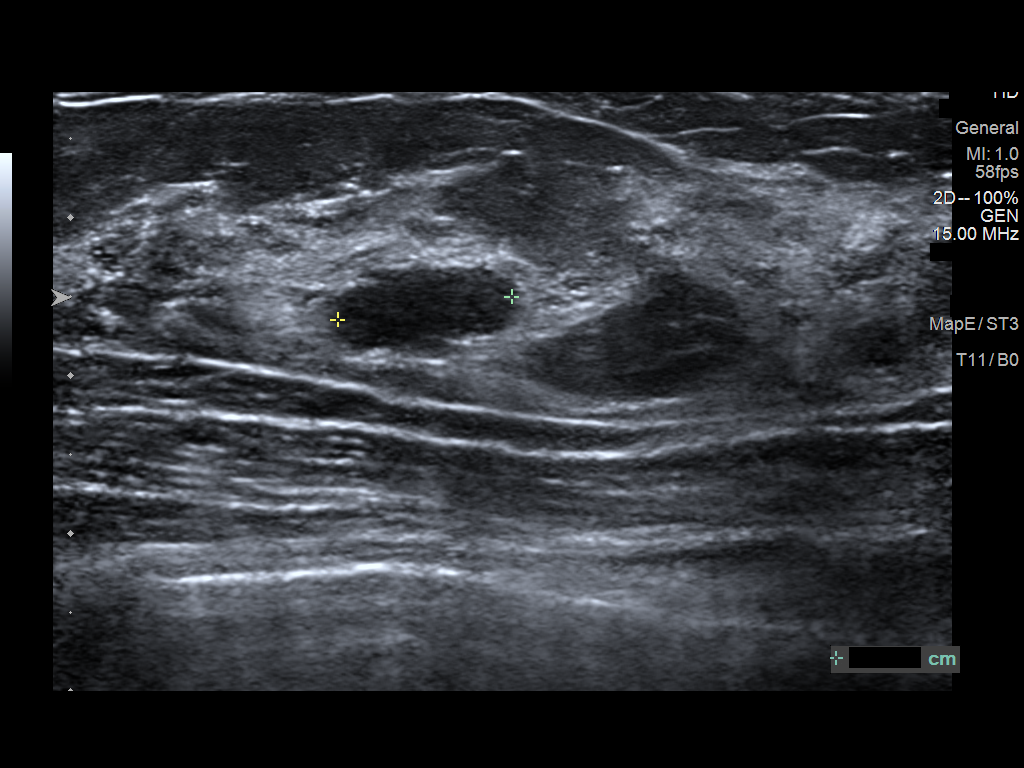

[5 of 5 positions shown; findings below may reference images not displayed]

ACR Breast Density Category c: The breast tissue is heterogeneously
dense, which may obscure small masses.
FINDINGS: Stable circumscribed equal density masses are identified in the
bilateral breasts. Post biopsy changes are identified on the left.
No new or suspicious findings are identified in either breast.

Mammographic images were processed with CAD.

Targeted ultrasound is performed, showing stable appearance of an
oval, circumscribed hypoechoic mass at the 1 o'clock position 2 cm
from the nipple on the right. It measures 1.2 x 1.1 x 0.6 cm. Within
the left breast. A stable, gently lobulated mass at the [DATE]
position 3 cm from the nipple measures 0.9 x 0.8 x 0.6 cm.
IMPRESSION: 1. Stable, probably benign probable fibroadenomas in the bilateral
breasts. Recommendation is for a final ultrasound follow-up in 6
months to a demonstrate 2 year stability with anticipation of
returning to annual screening mammography in May 2019.
2. No mammographic evidence of malignancy in either breast.

RECOMMENDATION:
Bilateral ultrasound in 6 months.

I have discussed the findings and recommendations with the patient.
Results were also provided in writing at the conclusion of the
visit. If applicable, a reminder letter will be sent to the patient
regarding the next appointment.

BI-RADS CATEGORY  3: Probably benign.

## 2019-01-05 ENCOUNTER — Other Ambulatory Visit: Payer: Self-pay | Admitting: Family Medicine

## 2019-01-05 DIAGNOSIS — F419 Anxiety disorder, unspecified: Secondary | ICD-10-CM

## 2019-07-04 ENCOUNTER — Other Ambulatory Visit: Payer: Self-pay | Admitting: Family Medicine

## 2019-07-04 DIAGNOSIS — G8929 Other chronic pain: Secondary | ICD-10-CM

## 2019-07-04 DIAGNOSIS — R519 Headache, unspecified: Secondary | ICD-10-CM

## 2019-10-09 ENCOUNTER — Other Ambulatory Visit: Payer: Self-pay | Admitting: Obstetrics and Gynecology

## 2019-10-10 ENCOUNTER — Other Ambulatory Visit: Payer: Self-pay | Admitting: Obstetrics and Gynecology

## 2019-10-10 DIAGNOSIS — N6310 Unspecified lump in the right breast, unspecified quadrant: Secondary | ICD-10-CM

## 2019-10-12 ENCOUNTER — Other Ambulatory Visit: Payer: Self-pay | Admitting: Family Medicine

## 2019-10-12 DIAGNOSIS — G8929 Other chronic pain: Secondary | ICD-10-CM

## 2019-10-12 DIAGNOSIS — R519 Headache, unspecified: Secondary | ICD-10-CM

## 2019-10-12 NOTE — Telephone Encounter (Signed)
Last OV 05/09/18 Last refill 07/04/19 #10/2 Next OV 12/04/19

## 2019-10-16 ENCOUNTER — Other Ambulatory Visit: Payer: Self-pay | Admitting: Obstetrics and Gynecology

## 2019-10-16 DIAGNOSIS — N63 Unspecified lump in unspecified breast: Secondary | ICD-10-CM

## 2019-11-02 ENCOUNTER — Ambulatory Visit
Admission: RE | Admit: 2019-11-02 | Discharge: 2019-11-02 | Disposition: A | Discharge status: Home / Self Care | Payer: BLUE CROSS/BLUE SHIELD | Source: Ambulatory Visit | Attending: Obstetrics and Gynecology | Admitting: Obstetrics and Gynecology

## 2019-11-02 ENCOUNTER — Ambulatory Visit
Admission: RE | Admit: 2019-11-02 | Discharge: 2019-11-02 | Disposition: A | Discharge status: Home / Self Care | Payer: 59 | Source: Ambulatory Visit | Attending: Obstetrics and Gynecology | Admitting: Obstetrics and Gynecology

## 2019-11-02 ENCOUNTER — Other Ambulatory Visit: Payer: Self-pay

## 2019-11-02 DIAGNOSIS — N63 Unspecified lump in unspecified breast: Secondary | ICD-10-CM

## 2019-12-03 ENCOUNTER — Other Ambulatory Visit: Payer: Self-pay

## 2019-12-04 ENCOUNTER — Other Ambulatory Visit: Payer: Self-pay

## 2019-12-04 ENCOUNTER — Encounter: Payer: Self-pay | Admitting: Family Medicine

## 2019-12-04 ENCOUNTER — Ambulatory Visit (INDEPENDENT_AMBULATORY_CARE_PROVIDER_SITE_OTHER): Payer: 59 | Admitting: Family Medicine

## 2019-12-04 VITALS — BP 98/60 | HR 72 | Temp 97.2°F | Resp 18 | Ht 65.0 in | Wt 136.0 lb

## 2019-12-04 DIAGNOSIS — F419 Anxiety disorder, unspecified: Secondary | ICD-10-CM

## 2019-12-04 DIAGNOSIS — R519 Headache, unspecified: Secondary | ICD-10-CM | POA: Diagnosis not present

## 2019-12-04 DIAGNOSIS — Z0000 Encounter for general adult medical examination without abnormal findings: Secondary | ICD-10-CM

## 2019-12-04 DIAGNOSIS — H538 Other visual disturbances: Secondary | ICD-10-CM | POA: Diagnosis not present

## 2019-12-04 DIAGNOSIS — G8929 Other chronic pain: Secondary | ICD-10-CM

## 2019-12-04 LAB — COMPREHENSIVE METABOLIC PANEL
ALT: 13 U/L (ref 0–35)
AST: 14 U/L (ref 0–37)
Albumin: 4.1 g/dL (ref 3.5–5.2)
Alkaline Phosphatase: 67 U/L (ref 39–117)
BUN: 6 mg/dL (ref 6–23)
CO2: 29 mEq/L (ref 19–32)
Calcium: 8.7 mg/dL (ref 8.4–10.5)
Chloride: 106 mEq/L (ref 96–112)
Creatinine, Ser: 0.75 mg/dL (ref 0.40–1.20)
GFR: 83.32 mL/min (ref >60.00)
Glucose, Bld: 94 mg/dL (ref 70–99)
Potassium: 3.9 mEq/L (ref 3.5–5.1)
Sodium: 140 mEq/L (ref 135–145)
Total Bilirubin: 0.7 mg/dL (ref 0.2–1.2)
Total Protein: 6.4 g/dL (ref 6.0–8.3)

## 2019-12-04 LAB — CBC WITH DIFFERENTIAL/PLATELET
Basophils Absolute: 0 10*3/uL (ref 0.0–0.1)
Basophils Relative: 0.6 % (ref 0.0–3.0)
Eosinophils Absolute: 0.1 10*3/uL (ref 0.0–0.7)
Eosinophils Relative: 1.1 % (ref 0.0–5.0)
HCT: 38.8 % (ref 36.0–46.0)
Hemoglobin: 13.2 g/dL (ref 12.0–15.0)
Lymphocytes Relative: 24.2 % (ref 12.0–46.0)
Lymphs Abs: 1.5 10*3/uL (ref 0.7–4.0)
MCHC: 34 g/dL (ref 30.0–36.0)
MCV: 85.6 fl (ref 78.0–100.0)
Monocytes Absolute: 0.4 10*3/uL (ref 0.1–1.0)
Monocytes Relative: 6 % (ref 3.0–12.0)
Neutro Abs: 4.2 10*3/uL (ref 1.4–7.7)
Neutrophils Relative %: 68.1 % (ref 43.0–77.0)
Platelets: 247 10*3/uL (ref 150.0–400.0)
RBC: 4.54 Mil/uL (ref 3.87–5.11)
RDW: 14.1 % (ref 11.5–15.5)
WBC: 6.2 10*3/uL (ref 4.0–10.5)

## 2019-12-04 LAB — LIPID PANEL
Cholesterol: 184 mg/dL (ref 0–200)
HDL: 51 mg/dL (ref >39.00)
LDL Cholesterol: 111 mg/dL — ABNORMAL HIGH (ref 0–99)
NonHDL: 133.06
Total CHOL/HDL Ratio: 4
Triglycerides: 108 mg/dL (ref 0.0–149.0)
VLDL: 21.6 mg/dL (ref 0.0–40.0)

## 2019-12-04 LAB — TSH: TSH: 2.8 u[IU]/mL (ref 0.35–4.50)

## 2019-12-04 MED ORDER — ESCITALOPRAM OXALATE 10 MG PO TABS: 10.0000 mg | ORAL_TABLET | Freq: Every day | ORAL | 3 refills | Status: DC

## 2019-12-04 MED ORDER — ALPRAZOLAM 0.25 MG PO TABS: 0.2500 mg | ORAL_TABLET | Freq: Every evening | ORAL | 0 refills | Status: DC | PRN

## 2019-12-04 MED ORDER — RIZATRIPTAN BENZOATE 5 MG PO TABS: ORAL_TABLET | ORAL | 2 refills | Status: DC

## 2019-12-04 NOTE — Patient Instructions (Signed)
Preventive Care 46-46 Years Old, Female Preventive care refers to visits with your health care provider and lifestyle choices that can promote health and wellness. This includes:  A yearly physical exam. This may also be called an annual well check.  Regular dental visits and eye exams.  Immunizations.  Screening for certain conditions.  Healthy lifestyle choices, such as eating a healthy diet, getting regular exercise, not using drugs or products that contain nicotine and tobacco, and limiting alcohol use. What can I expect for my preventive care visit? Physical exam Your health care provider will check your:  Height and weight. This may be used to calculate body mass index (BMI), which tells if you are at a healthy weight.  Heart rate and blood pressure.  Skin for abnormal spots. Counseling Your health care provider may ask you questions about your:  Alcohol, tobacco, and drug use.  Emotional well-being.  Home and relationship well-being.  Sexual activity.  Eating habits.  Work and work Statistician.  Method of birth control.  Menstrual cycle.  Pregnancy history. What immunizations do I need?  Influenza (flu) vaccine  This is recommended every year. Tetanus, diphtheria, and pertussis (Tdap) vaccine  You may need a Td booster every 10 years. Varicella (chickenpox) vaccine  You may need this if you have not been vaccinated. Zoster (shingles) vaccine  You may need this after age 21. Measles, mumps, and rubella (MMR) vaccine  You may need at least one dose of MMR if you were born in 1957 or later. You may also need a second dose. Pneumococcal conjugate (PCV13) vaccine  You may need this if you have certain conditions and were not previously vaccinated. Pneumococcal polysaccharide (PPSV23) vaccine  You may need one or two doses if you smoke cigarettes or if you have certain conditions. Meningococcal conjugate (MenACWY) vaccine  You may need this if you  have certain conditions. Hepatitis A vaccine  You may need this if you have certain conditions or if you travel or work in places where you may be exposed to hepatitis A. Hepatitis B vaccine  You may need this if you have certain conditions or if you travel or work in places where you may be exposed to hepatitis B. Haemophilus influenzae type b (Hib) vaccine  You may need this if you have certain conditions. Human papillomavirus (HPV) vaccine  If recommended by your health care provider, you may need three doses over 6 months. You may receive vaccines as individual doses or as more than one vaccine together in one shot (combination vaccines). Talk with your health care provider about the risks and benefits of combination vaccines. What tests do I need? Blood tests  Lipid and cholesterol levels. These may be checked every 5 years, or more frequently if you are over 46 years old.  Hepatitis C test.  Hepatitis B test. Screening  Lung cancer screening. You may have this screening every year starting at age 46 if you have a 30-pack-year history of smoking and currently smoke or have quit within the past 15 years.  Colorectal cancer screening. All adults should have this screening starting at age 46 and continuing until age 46. Your health care provider may recommend screening at age 46 if you are at increased risk. You will have tests every 1-10 years, depending on your results and the type of screening test.  Diabetes screening. This is done by checking your blood sugar (glucose) after you have not eaten for a while (fasting). You may have this  done every 1-3 years.  Mammogram. This may be done every 1-2 years. Talk with your health care provider about when you should start having regular mammograms. This may depend on whether you have a family history of breast cancer.  BRCA-related cancer screening. This may be done if you have a family history of breast, ovarian, tubal, or peritoneal  cancers.  Pelvic exam and Pap test. This may be done every 3 years starting at age 46. Starting at age 46, this may be done every 5 years if you have a Pap test in combination with an HPV test. Other tests  Sexually transmitted disease (STD) testing.  Bone density scan. This is done to screen for osteoporosis. You may have this scan if you are at high risk for osteoporosis. Follow these instructions at home: Eating and drinking  Eat a diet that includes fresh fruits and vegetables, whole grains, lean protein, and low-fat dairy.  Take vitamin and mineral supplements as recommended by your health care provider.  Do not drink alcohol if: ? Your health care provider tells you not to drink. ? You are pregnant, may be pregnant, or are planning to become pregnant.  If you drink alcohol: ? Limit how much you have to 0-1 drink a day. ? Be aware of how much alcohol is in your drink. In the [place] one drink equals one 12 oz bottle of beer (355 mL), one 5 oz glass of wine (148 mL), or one 1 oz glass of hard liquor (44 mL). Lifestyle  Take daily care of your teeth and gums.  Stay active. Exercise for at least 30 minutes on 5 or more days each week.  Do not use any products that contain nicotine or tobacco, such as cigarettes, e-cigarettes, and chewing tobacco. If you need help quitting, ask your health care provider.  If you are sexually active, practice safe sex. Use a condom or other form of birth control (contraception) in order to prevent pregnancy and STIs (sexually transmitted infections).  If told by your health care provider, take low-dose aspirin daily starting at age 26. What's next?  Visit your health care provider once a year for a well check visit.  Ask your health care provider how often you should have your eyes and teeth checked.  Stay up to date on all vaccines. This information is not intended to replace advice given to you by your health care provider. Make sure you  discuss any questions you have with your health care provider. Document Revised: 06/22/2018 Document Reviewed: 06/22/2018 Elsevier Patient Education  2020 [employer].

## 2019-12-04 NOTE — Progress Notes (Signed)
Subjective:     Mckenzie Harris is a 44 y.o. female and is here for a comprehensive physical exam. The patient reports no problems.  Social History   Socioeconomic History  . Marital status: Married    Spouse name: Not on file  . Number of children: Not on file  . Years of education: Not on file  . Highest education level: Not on file  Occupational History  . Occupation: Print production planner  Tobacco Use  . Smoking status: Former Smoker    Packs/day: 0.00    Years: 20.00    Pack years: 0.00  . Smokeless tobacco: Never Used  Substance and Sexual Activity  . Alcohol use: Yes  . Drug use: No  . Sexual activity: Not Currently  Other Topics Concern  . Not on file  Social History Narrative   Regular exercise: no   Caffeine use: diet coke daily   Social Determinants of Health   Financial Resource Strain:   . Difficulty of Paying Living Expenses: Not on file  Food Insecurity:   . Worried About Charity fundraiser in the Last Year: Not on file  . Ran Out of Food in the Last Year: Not on file  Transportation Needs:   . Lack of Transportation (Medical): Not on file  . Lack of Transportation (Non-Medical): Not on file  Physical Activity:   . Days of Exercise per Week: Not on file  . Minutes of Exercise per Session: Not on file  Stress:   . Feeling of Stress : Not on file  Social Connections:   . Frequency of Communication with Friends and Family: Not on file  . Frequency of Social Gatherings with Friends and Family: Not on file  . Attends Religious Services: Not on file  . Active Member of Clubs or Organizations: Not on file  . Attends Archivist Meetings: Not on file  . Marital Status: Not on file  Intimate Partner Violence:   . Fear of Current or Ex-Partner: Not on file  . Emotionally Abused: Not on file  . Physically Abused: Not on file  . Sexually Abused: Not on file   Health Maintenance  Topic Date Due  . TETANUS/TDAP  10/21/2018  . PAP SMEAR-Modifier   11/19/2022  . INFLUENZA VACCINE  Completed  . HIV Screening  Completed    The following portions of the patient's history were reviewed and updated as appropriate:  She  has a past medical history of Anxiety and HSV-2 infection. She does not have any pertinent problems on file. She  has a past surgical history that includes Breast biopsy. Her family history includes Anxiety disorder in an other family member; Atrial fibrillation in her father; Heart disease (age of onset: 38) in her father; Hyperlipidemia in her father; Hypertension in her father. She  reports that she has quit smoking. She smoked 0.00 packs per day for 20.00 years. She has never used smokeless tobacco. She reports current alcohol use. She reports that she does not use drugs. She has a current medication list which includes the following prescription(s): escitalopram, rizatriptan, and alprazolam. No current outpatient medications on file prior to visit.   No current facility-administered medications on file prior to visit.   She has No Known Allergies..  Review of Systems  Review of Systems  Constitutional: Negative for activity change, appetite change and fatigue.  HENT: Negative for hearing loss, congestion, tinnitus and ear discharge.  dentist q41m Eyes: Negative for visual disturbance (see optho q1y --  vision corrected to 20/20 with glasses).  Respiratory: Negative for cough, chest tightness and shortness of breath.   Cardiovascular: Negative for chest pain, palpitations and leg swelling.  Gastrointestinal: Negative for abdominal pain, diarrhea, constipation and abdominal distention.  Genitourinary: Negative for urgency, frequency, decreased urine volume and difficulty urinating.  Musculoskeletal: Negative for back pain, arthralgias and gait problem.  Skin: Negative for color change, pallor and rash.  Neurological: Negative for dizziness, light-headedness, numbness and headaches.  Hematological: Negative for  adenopathy. Does not bruise/bleed easily.  Psychiatric/Behavioral: Negative for suicidal ideas, confusion, sleep disturbance, self-injury, dysphoric mood, decreased concentration and agitation.      Objective:    BP 98/60 (BP Location: Right Arm, Patient Position: Sitting, Cuff Size: Normal)   Pulse 72   Temp (!) 97.2 F (36.2 C) (Temporal)   Resp 18   Ht 5\' 5"  (1.651 m)   Wt 136 lb (61.7 kg)   SpO2 98%   BMI 22.63 kg/m  General appearance: alert, cooperative, appears stated age and no distress Head: Normocephalic, without obvious abnormality, atraumatic Eyes: negative findings: lids and lashes normal, conjunctivae and sclerae normal and pupils equal, round, reactive to light and accomodation Ears: normal TM's and external ear canals both ears Neck: no adenopathy, no carotid bruit, no JVD, supple, symmetrical, trachea midline and thyroid not enlarged, symmetric, no tenderness/mass/nodules Back: symmetric, no curvature. ROM normal. No CVA tenderness. Lungs: clear to auscultation bilaterally Breasts: gyn Heart: regular rate and rhythm, S1, S2 normal, no murmur, click, rub or gallop Abdomen: soft, non-tender; bowel sounds normal; no masses,  no organomegaly Pelvic: deferred--gyn Extremities: extremities normal, atraumatic, no cyanosis or edema Pulses: 2+ and symmetric Skin: Skin color, texture, turgor normal. No rashes or lesions Lymph nodes: Cervical, supraclavicular, and axillary nodes normal. Neurologic: Alert and oriented X 3, normal strength and tone. Normal symmetric reflexes. Normal coordination and gait    Assessment:    Healthy female exam.      Plan:    ghm utd Check labs  See After Visit Summary for Counseling Recommendations

## 2019-12-07 ENCOUNTER — Other Ambulatory Visit: Payer: Self-pay | Admitting: Family Medicine

## 2019-12-07 DIAGNOSIS — E785 Hyperlipidemia, unspecified: Secondary | ICD-10-CM

## 2020-01-14 ENCOUNTER — Other Ambulatory Visit: Payer: Self-pay | Admitting: General Surgery

## 2020-02-07 ENCOUNTER — Other Ambulatory Visit (HOSPITAL_COMMUNITY): Payer: 59

## 2020-02-16 ENCOUNTER — Other Ambulatory Visit (HOSPITAL_COMMUNITY): Payer: 59

## 2020-02-26 ENCOUNTER — Encounter: Payer: Self-pay | Admitting: Family Medicine

## 2020-02-26 DIAGNOSIS — R519 Headache, unspecified: Secondary | ICD-10-CM

## 2020-02-26 DIAGNOSIS — G8929 Other chronic pain: Secondary | ICD-10-CM

## 2020-02-27 MED ORDER — RIZATRIPTAN BENZOATE 5 MG PO TABS: ORAL_TABLET | ORAL | 2 refills | Status: DC

## 2020-03-03 ENCOUNTER — Other Ambulatory Visit: Payer: 59

## 2020-03-03 ENCOUNTER — Other Ambulatory Visit (INDEPENDENT_AMBULATORY_CARE_PROVIDER_SITE_OTHER): Payer: 59

## 2020-03-03 ENCOUNTER — Other Ambulatory Visit: Payer: Self-pay

## 2020-03-03 ENCOUNTER — Encounter: Payer: Self-pay | Admitting: Family Medicine

## 2020-03-03 ENCOUNTER — Other Ambulatory Visit: Payer: Self-pay | Admitting: Family Medicine

## 2020-03-03 DIAGNOSIS — E785 Hyperlipidemia, unspecified: Secondary | ICD-10-CM

## 2020-03-03 DIAGNOSIS — R5383 Other fatigue: Secondary | ICD-10-CM

## 2020-03-03 LAB — COMPREHENSIVE METABOLIC PANEL
ALT: 19 U/L (ref 0–35)
AST: 21 U/L (ref 0–37)
Albumin: 4.1 g/dL (ref 3.5–5.2)
Alkaline Phosphatase: 67 U/L (ref 39–117)
BUN: 8 mg/dL (ref 6–23)
CO2: 29 mEq/L (ref 19–32)
Calcium: 8.8 mg/dL (ref 8.4–10.5)
Chloride: 104 mEq/L (ref 96–112)
Creatinine, Ser: 0.76 mg/dL (ref 0.40–1.20)
GFR: 81.96 mL/min (ref >60.00)
Glucose, Bld: 105 mg/dL — ABNORMAL HIGH (ref 70–99)
Potassium: 4.2 mEq/L (ref 3.5–5.1)
Sodium: 140 mEq/L (ref 135–145)
Total Bilirubin: 0.7 mg/dL (ref 0.2–1.2)
Total Protein: 6.4 g/dL (ref 6.0–8.3)

## 2020-03-03 LAB — LIPID PANEL
Cholesterol: 179 mg/dL (ref 0–200)
HDL: 54.4 mg/dL (ref >39.00)
LDL Cholesterol: 105 mg/dL — ABNORMAL HIGH (ref 0–99)
NonHDL: 124.24
Total CHOL/HDL Ratio: 3
Triglycerides: 97 mg/dL (ref 0.0–149.0)
VLDL: 19.4 mg/dL (ref 0.0–40.0)

## 2020-03-03 NOTE — Telephone Encounter (Signed)
Add on request faxed to [facility].

## 2020-03-03 NOTE — Telephone Encounter (Signed)
Dr Carollee Herter --  Pt is asking to have thyroid testing added to labs that were drawn today due to the below symptoms.  Please place future order if appropriate.

## 2020-03-03 NOTE — Telephone Encounter (Signed)
Ok to add

## 2020-03-03 NOTE — Addendum Note (Signed)
Addended by: Kelle Darting A on: 03/03/2020 03:06 PM   Modules accepted: Orders

## 2020-03-03 NOTE — Telephone Encounter (Signed)
Mckenzie Harris to [employer], Hunker R, DO    03/03/20 10:16 AM Yes. When I look at the symptoms online I have a few, fatigue, sluggishness, weight gain,high cholesterol, constipation and dry skin. Maybe is just aging?? But just thought it would be nice to rule that out... Thanks!

## 2020-03-03 NOTE — Telephone Encounter (Signed)
Please see mychart message from earlier today.

## 2020-03-04 LAB — THYROID PANEL WITH TSH
Free Thyroxine Index: 2 (ref 1.4–3.8)
T3 Uptake: 30 % (ref 22–35)
T4, Total: 6.7 ug/dL (ref 5.1–11.9)
TSH: 2.9 mIU/L

## 2020-03-14 ENCOUNTER — Other Ambulatory Visit: Payer: Self-pay | Admitting: Family Medicine

## 2020-03-14 DIAGNOSIS — R519 Headache, unspecified: Secondary | ICD-10-CM

## 2020-03-25 ENCOUNTER — Encounter: Payer: Self-pay | Admitting: Family Medicine

## 2020-03-26 ENCOUNTER — Encounter (HOSPITAL_BASED_OUTPATIENT_CLINIC_OR_DEPARTMENT_OTHER): Payer: Self-pay | Admitting: General Surgery

## 2020-03-26 ENCOUNTER — Other Ambulatory Visit: Payer: Self-pay

## 2020-03-26 NOTE — Telephone Encounter (Signed)
That is fine 

## 2020-03-26 NOTE — Telephone Encounter (Signed)
Could you please schedule patient's daughter, Mckenzie Harris, for a new patient appointment please?

## 2020-03-26 NOTE — Telephone Encounter (Signed)
Please advise. Also I haven't received tele note regarding request for new patient

## 2020-03-28 ENCOUNTER — Other Ambulatory Visit (HOSPITAL_COMMUNITY)
Admission: RE | Admit: 2020-03-28 | Discharge: 2020-03-28 | Disposition: A | Discharge status: Home / Self Care | Payer: 59 | Source: Ambulatory Visit | Attending: General Surgery | Admitting: General Surgery

## 2020-03-28 DIAGNOSIS — Z20822 Contact with and (suspected) exposure to covid-19: Secondary | ICD-10-CM | POA: Insufficient documentation

## 2020-03-28 DIAGNOSIS — Z01812 Encounter for preprocedural laboratory examination: Secondary | ICD-10-CM | POA: Diagnosis present

## 2020-03-28 MED ORDER — ENSURE PRE-SURGERY PO LIQD: 296.0000 mL | Freq: Once | ORAL | Status: DC

## 2020-03-28 NOTE — Progress Notes (Signed)

## 2020-03-29 ENCOUNTER — Other Ambulatory Visit (HOSPITAL_COMMUNITY): Payer: 59

## 2020-03-29 LAB — SARS CORONAVIRUS 2 (TAT 6-24 HRS): SARS Coronavirus 2: NEGATIVE

## 2020-04-01 ENCOUNTER — Ambulatory Visit (HOSPITAL_BASED_OUTPATIENT_CLINIC_OR_DEPARTMENT_OTHER): Payer: 59 | Admitting: Certified Registered Nurse Anesthetist

## 2020-04-01 ENCOUNTER — Ambulatory Visit (HOSPITAL_BASED_OUTPATIENT_CLINIC_OR_DEPARTMENT_OTHER)
Admission: RE | Admit: 2020-04-01 | Discharge: 2020-04-01 | Disposition: A | Discharge status: Home / Self Care | Payer: 59 | Attending: General Surgery | Admitting: General Surgery

## 2020-04-01 ENCOUNTER — Other Ambulatory Visit: Payer: Self-pay

## 2020-04-01 ENCOUNTER — Encounter (HOSPITAL_BASED_OUTPATIENT_CLINIC_OR_DEPARTMENT_OTHER)
Admission: RE | Disposition: A | Discharge status: Home / Self Care | Payer: Self-pay | Source: Home / Self Care | Attending: General Surgery

## 2020-04-01 ENCOUNTER — Encounter (HOSPITAL_BASED_OUTPATIENT_CLINIC_OR_DEPARTMENT_OTHER): Payer: Self-pay | Admitting: General Surgery

## 2020-04-01 DIAGNOSIS — D242 Benign neoplasm of left breast: Secondary | ICD-10-CM | POA: Insufficient documentation

## 2020-04-01 DIAGNOSIS — F419 Anxiety disorder, unspecified: Secondary | ICD-10-CM | POA: Diagnosis not present

## 2020-04-01 DIAGNOSIS — Z87891 Personal history of nicotine dependence: Secondary | ICD-10-CM | POA: Diagnosis not present

## 2020-04-01 DIAGNOSIS — Z8041 Family history of malignant neoplasm of ovary: Secondary | ICD-10-CM | POA: Insufficient documentation

## 2020-04-01 DIAGNOSIS — N6310 Unspecified lump in the right breast, unspecified quadrant: Secondary | ICD-10-CM | POA: Insufficient documentation

## 2020-04-01 DIAGNOSIS — N6320 Unspecified lump in the left breast, unspecified quadrant: Secondary | ICD-10-CM | POA: Diagnosis present

## 2020-04-01 HISTORY — DX: Nausea with vomiting, unspecified: R11.2

## 2020-04-01 HISTORY — DX: Other specified postprocedural states: Z98.890

## 2020-04-01 HISTORY — PX: BREAST LUMPECTOMY: SHX2

## 2020-04-01 LAB — POCT PREGNANCY, URINE: Preg Test, Ur: NEGATIVE

## 2020-04-01 SURGERY — BREAST LUMPECTOMY
Anesthesia: General | Site: Breast | Laterality: Left

## 2020-04-01 MED ORDER — CEFAZOLIN SODIUM-DEXTROSE 2-4 GM/100ML-% IV SOLN
INTRAVENOUS | Status: AC
  Filled 2020-04-01: qty 100

## 2020-04-01 MED ORDER — DROPERIDOL 2.5 MG/ML IJ SOLN
INTRAMUSCULAR | Status: AC
  Filled 2020-04-01: qty 2

## 2020-04-01 MED ORDER — MEPERIDINE HCL 25 MG/ML IJ SOLN: 6.2500 mg | INTRAMUSCULAR | Status: DC | PRN

## 2020-04-01 MED ORDER — LIDOCAINE 2% (20 MG/ML) 5 ML SYRINGE
INTRAMUSCULAR | Status: DC | PRN
  Administered 2020-04-01: 60 mg via INTRAVENOUS

## 2020-04-01 MED ORDER — PROPOFOL 10 MG/ML IV BOLUS
INTRAVENOUS | Status: DC | PRN
  Administered 2020-04-01: 200 mg via INTRAVENOUS

## 2020-04-01 MED ORDER — ACETAMINOPHEN 500 MG PO TABS
1000.0000 mg | ORAL_TABLET | ORAL | Status: AC
  Administered 2020-04-01: 1000 mg via ORAL

## 2020-04-01 MED ORDER — GABAPENTIN 100 MG PO CAPS
100.0000 mg | ORAL_CAPSULE | ORAL | Status: AC
  Administered 2020-04-01: 100 mg via ORAL

## 2020-04-01 MED ORDER — MIDAZOLAM HCL 5 MG/5ML IJ SOLN
INTRAMUSCULAR | Status: DC | PRN
  Administered 2020-04-01: 2 mg via INTRAVENOUS

## 2020-04-01 MED ORDER — SCOPOLAMINE 1 MG/3DAYS TD PT72
MEDICATED_PATCH | TRANSDERMAL | Status: AC
  Filled 2020-04-01: qty 1

## 2020-04-01 MED ORDER — DEXAMETHASONE SODIUM PHOSPHATE 10 MG/ML IJ SOLN
INTRAMUSCULAR | Status: AC
  Filled 2020-04-01: qty 1

## 2020-04-01 MED ORDER — CEFAZOLIN SODIUM-DEXTROSE 2-4 GM/100ML-% IV SOLN
2.0000 g | INTRAVENOUS | Status: AC
  Administered 2020-04-01: 2 g via INTRAVENOUS

## 2020-04-01 MED ORDER — FENTANYL CITRATE (PF) 100 MCG/2ML IJ SOLN
INTRAMUSCULAR | Status: DC | PRN
  Administered 2020-04-01: 50 ug via INTRAVENOUS

## 2020-04-01 MED ORDER — LIDOCAINE 2% (20 MG/ML) 5 ML SYRINGE
INTRAMUSCULAR | Status: AC
  Filled 2020-04-01: qty 5

## 2020-04-01 MED ORDER — ACETAMINOPHEN 500 MG PO TABS
ORAL_TABLET | ORAL | Status: AC
  Filled 2020-04-01: qty 1

## 2020-04-01 MED ORDER — DIPHENHYDRAMINE HCL 50 MG/ML IJ SOLN
INTRAMUSCULAR | Status: AC
  Filled 2020-04-01: qty 1

## 2020-04-01 MED ORDER — KETOROLAC TROMETHAMINE 15 MG/ML IJ SOLN
INTRAMUSCULAR | Status: AC
  Filled 2020-04-01: qty 1

## 2020-04-01 MED ORDER — ONDANSETRON HCL 4 MG/2ML IJ SOLN
INTRAMUSCULAR | Status: AC
  Filled 2020-04-01: qty 2

## 2020-04-01 MED ORDER — OXYCODONE HCL 5 MG/5ML PO SOLN: 5.0000 mg | Freq: Once | ORAL | Status: DC | PRN

## 2020-04-01 MED ORDER — HYDROMORPHONE HCL 1 MG/ML IJ SOLN: 0.2500 mg | INTRAMUSCULAR | Status: DC | PRN

## 2020-04-01 MED ORDER — FENTANYL CITRATE (PF) 100 MCG/2ML IJ SOLN
INTRAMUSCULAR | Status: AC
  Filled 2020-04-01: qty 2

## 2020-04-01 MED ORDER — EPHEDRINE SULFATE-NACL 50-0.9 MG/10ML-% IV SOSY
PREFILLED_SYRINGE | INTRAVENOUS | Status: DC | PRN
  Administered 2020-04-01: 10 mg via INTRAVENOUS

## 2020-04-01 MED ORDER — KETOROLAC TROMETHAMINE 15 MG/ML IJ SOLN
15.0000 mg | INTRAMUSCULAR | Status: AC
  Administered 2020-04-01: 15 mg via INTRAVENOUS

## 2020-04-01 MED ORDER — GABAPENTIN 100 MG PO CAPS
ORAL_CAPSULE | ORAL | Status: AC
  Filled 2020-04-01: qty 1

## 2020-04-01 MED ORDER — PROMETHAZINE HCL 25 MG/ML IJ SOLN: 6.2500 mg | INTRAMUSCULAR | Status: DC | PRN

## 2020-04-01 MED ORDER — DROPERIDOL 2.5 MG/ML IJ SOLN
INTRAMUSCULAR | Status: DC | PRN
Start: 2020-04-01 — End: 2020-04-01
  Administered 2020-04-01: .625 mg via INTRAVENOUS

## 2020-04-01 MED ORDER — MIDAZOLAM HCL 2 MG/2ML IJ SOLN
INTRAMUSCULAR | Status: AC
  Filled 2020-04-01: qty 2

## 2020-04-01 MED ORDER — ONDANSETRON HCL 4 MG/2ML IJ SOLN
INTRAMUSCULAR | Status: DC | PRN
  Administered 2020-04-01: 4 mg via INTRAVENOUS

## 2020-04-01 MED ORDER — DEXAMETHASONE SODIUM PHOSPHATE 4 MG/ML IJ SOLN
INTRAMUSCULAR | Status: DC | PRN
  Administered 2020-04-01: 5 mg via INTRAVENOUS

## 2020-04-01 MED ORDER — LACTATED RINGERS IV SOLN: INTRAVENOUS | Status: DC

## 2020-04-01 MED ORDER — BUPIVACAINE HCL (PF) 0.25 % IJ SOLN
INTRAMUSCULAR | Status: DC | PRN
  Administered 2020-04-01: 10 mL

## 2020-04-01 MED ORDER — DIPHENHYDRAMINE HCL 50 MG/ML IJ SOLN
INTRAMUSCULAR | Status: DC | PRN
  Administered 2020-04-01: 12.5 mg via INTRAVENOUS

## 2020-04-01 MED ORDER — OXYCODONE HCL 5 MG PO TABS: 5.0000 mg | ORAL_TABLET | Freq: Once | ORAL | Status: DC | PRN

## 2020-04-01 SURGICAL SUPPLY — 46 items
ADH SKN CLS APL DERMABOND .7 (GAUZE/BANDAGES/DRESSINGS) ×1
APL PRP STRL LF DISP 70% ISPRP (MISCELLANEOUS) ×1
BINDER BREAST LRG (GAUZE/BANDAGES/DRESSINGS) ×1 IMPLANT
BINDER BREAST XLRG (GAUZE/BANDAGES/DRESSINGS) IMPLANT
BLADE SURG 15 STRL LF DISP TIS (BLADE) ×1 IMPLANT
BLADE SURG 15 STRL SS (BLADE) ×2
CHLORAPREP W/TINT 26 (MISCELLANEOUS) ×2 IMPLANT
COVER BACK TABLE 60X90IN (DRAPES) ×2 IMPLANT
COVER MAYO STAND STRL (DRAPES) ×2 IMPLANT
DERMABOND ADVANCED (GAUZE/BANDAGES/DRESSINGS) ×1
DERMABOND ADVANCED .7 DNX12 (GAUZE/BANDAGES/DRESSINGS) IMPLANT
DRAPE LAPAROSCOPIC ABDOMINAL (DRAPES) ×2 IMPLANT
DRAPE UTILITY XL STRL (DRAPES) ×2 IMPLANT
DRSG TEGADERM 4X4.75 (GAUZE/BANDAGES/DRESSINGS) ×2 IMPLANT
ELECT COATED BLADE 2.86 ST (ELECTRODE) ×2 IMPLANT
ELECT REM PT RETURN 9FT ADLT (ELECTROSURGICAL) ×2
ELECTRODE REM PT RTRN 9FT ADLT (ELECTROSURGICAL) ×1 IMPLANT
GAUZE SPONGE 4X4 12PLY STRL LF (GAUZE/BANDAGES/DRESSINGS) ×2 IMPLANT
GLOVE BIO SURGEON STRL SZ 6.5 (GLOVE) ×1 IMPLANT
GLOVE BIO SURGEON STRL SZ7 (GLOVE) ×2 IMPLANT
GLOVE BIOGEL PI IND STRL 6.5 (GLOVE) IMPLANT
GLOVE BIOGEL PI IND STRL 7.5 (GLOVE) ×1 IMPLANT
GLOVE BIOGEL PI INDICATOR 6.5 (GLOVE) ×1
GLOVE BIOGEL PI INDICATOR 7.5 (GLOVE) ×1
GOWN STRL REUS W/ TWL LRG LVL3 (GOWN DISPOSABLE) ×3 IMPLANT
GOWN STRL REUS W/TWL LRG LVL3 (GOWN DISPOSABLE) ×4
HEMOSTAT ARISTA ABSORB 3G PWDR (HEMOSTASIS) ×1 IMPLANT
KIT MARKER MARGIN INK (KITS) ×2 IMPLANT
NDL HYPO 25X1 1.5 SAFETY (NEEDLE) ×1 IMPLANT
NEEDLE HYPO 25X1 1.5 SAFETY (NEEDLE) ×2 IMPLANT
PENCIL SMOKE EVACUATOR (MISCELLANEOUS) ×2 IMPLANT
SET BASIN DAY SURGERY F.S. (CUSTOM PROCEDURE TRAY) ×2 IMPLANT
SLEEVE SCD COMPRESS KNEE MED (MISCELLANEOUS) ×2 IMPLANT
SPONGE LAP 4X18 RFD (DISPOSABLE) ×2 IMPLANT
STRIP CLOSURE SKIN 1/2X4 (GAUZE/BANDAGES/DRESSINGS) ×1 IMPLANT
SUT MON AB 5-0 PS2 18 (SUTURE) ×1 IMPLANT
SUT SILK 2 0 SH (SUTURE) ×2 IMPLANT
SUT VIC AB 2-0 SH 27 (SUTURE) ×2
SUT VIC AB 2-0 SH 27XBRD (SUTURE) ×1 IMPLANT
SUT VIC AB 3-0 SH 27 (SUTURE) ×2
SUT VIC AB 3-0 SH 27X BRD (SUTURE) ×1 IMPLANT
SYR CONTROL 10ML LL (SYRINGE) ×2 IMPLANT
TOWEL GREEN STERILE FF (TOWEL DISPOSABLE) ×2 IMPLANT
TRAY FAXITRON CT DISP (TRAY / TRAY PROCEDURE) ×1 IMPLANT
TUBE CONNECTING 20X1/4 (TUBING) ×1 IMPLANT
YANKAUER SUCT BULB TIP NO VENT (SUCTIONS) ×1 IMPLANT

## 2020-04-01 NOTE — Op Note (Signed)
Preoperative diagnosis: left breast mass Postoperative diagnosis: Same as above Procedure: Left breast mass excisional biopsy Surgeon: Dr. Serita Grammes Anesthesia: General Estimated blood loss: Minimal Specimens: Left breast mass containing clip marked with paint Complications: None Drains: None Special count was correct at completion Disposition to recovery in stable condition  Indications:43 yof with fh of ovarian cancer presents with left breast mass and right breast mass for over two years. no prior breast issues. no discharge. she followed up in January after undergoing initial evaluation in what appears to be 2018 when she noted left side had gotten larger. the right remains same to her. she had repeat mm that shows c density breasts. on mm there is right uoq mass with clip present. on the left there is a mass medially also with clip present. US of the right side shows this to be 2.6x1.4x2.4 cm that in 12/19 was 2.6x1.2x2 cm in size. there is an adjacent simple cyst as well. US of the left side shows a mass that is now 3.6x3.2x2.1 cm without left ax adenopathy. this previously in 12/19 was 2.6x1.5x1.8 cm in size. these appear both to be biopsied fibroadenomas. she is here to discuss options. We elected to remove the left side.   Procedure: After informed consent was obtained the patient was taken to the OR. She was then given antibiotics. SCDs were placed. She was placed under general anesthesia without complication. She was prepped and draped in the standard sterile surgical fashion. A surgical timeout was then performed.  I had identified the mass in the upper inner left breast. . I then made a periareolar incision in order to hide a scar later. I then dissected to the mass and then removed it in its entirety.  I then painted this. Mammogram confirmed removal of the clip. Hemostasis was obtained. I closed this with 2-0 Vicryl, 3-0 Vicryl, and 5-0 Monocryl. She tolerated this well  was extubated and transferred to recovery stable.

## 2020-04-01 NOTE — Anesthesia Procedure Notes (Signed)
Procedure Name: LMA Insertion Date/Time: 04/01/2020 9:52 AM Performed by: Alain Marion, CRNA Pre-anesthesia Checklist: Patient identified, Emergency Drugs available, Suction available and Patient being monitored Patient Re-evaluated:Patient Re-evaluated prior to induction Oxygen Delivery Method: Circle System Utilized Preoxygenation: Pre-oxygenation with 100% oxygen Induction Type: IV induction Ventilation: Mask ventilation without difficulty LMA: LMA inserted LMA Size: 4.0 Number of attempts: 1 Airway Equipment and Method: Bite block Placement Confirmation: positive ETCO2 Tube secured with: Tape Dental Injury: Teeth and Oropharynx as per pre-operative assessment

## 2020-04-01 NOTE — Anesthesia Postprocedure Evaluation (Signed)
Anesthesia Post Note  Patient: Mckenzie Harris  Procedure(s) Performed: LEFT BREAST MASS EXCISION (Left Breast)     Patient location during evaluation: PACU Anesthesia Type: General Level of consciousness: awake and alert Pain management: pain level controlled Vital Signs Assessment: post-procedure vital signs reviewed and stable Respiratory status: spontaneous breathing, nonlabored ventilation and respiratory function stable Cardiovascular status: blood pressure returned to baseline and stable Postop Assessment: no apparent nausea or vomiting Anesthetic complications: no    Last Vitals:  Vitals:   04/01/20 1110 04/01/20 1145  BP:  100/69  Pulse: 60 69  Resp: 13 14  Temp:  36.6 C  SpO2: 100% 99%    Last Pain:  Vitals:   04/01/20 1145  TempSrc:   PainSc: 0-No pain                 Lynda Rainwater

## 2020-04-01 NOTE — Discharge Instructions (Signed)
[facility] Office Phone Number [phone]  BREAST BIOPSY/ PARTIAL MASTECTOMY: POST OP INSTRUCTIONS Take 400 mg of ibuprofen every 8 hours or 650 mg tylenol every 6 hours for next 72 hours then as needed. Use ice several times daily also. Always review your discharge instruction sheet given to you by the facility where your surgery was performed.  IF YOU HAVE DISABILITY OR FAMILY LEAVE FORMS, YOU MUST BRING THEM TO THE OFFICE FOR PROCESSING.  DO NOT GIVE THEM TO YOUR DOCTOR.  1. A prescription for pain medication may be given to you upon discharge.  Take your pain medication as prescribed, if needed.  If narcotic pain medicine is not needed, then you may take acetaminophen (Tylenol), naprosyn (Alleve) or ibuprofen (Advil) as needed. 2. Take your usually prescribed medications unless otherwise directed 3. If you need a refill on your pain medication, please contact your pharmacy.  They will contact our office to request authorization.  Prescriptions will not be filled after 5pm or on week-ends. 4. You should eat very light the first 24 hours after surgery, such as soup, crackers, pudding, etc.  Resume your normal diet the day after surgery. 5. Most patients will experience some swelling and bruising in the breast.  Ice packs and a good support bra will help.  Wear the breast binder provided or a sports bra for 72 hours day and night.  After that wear a sports bra during the day until you return to the office. Swelling and bruising can take several days to resolve.  6. It is common to experience some constipation if taking pain medication after surgery.  Increasing fluid intake and taking a stool softener will usually help or prevent this problem from occurring.  A mild laxative (Milk of Magnesia or Miralax) should be taken according to package directions if there are no bowel movements after 48 hours. 7. Unless discharge instructions indicate otherwise, you may remove your bandages 48  hours after surgery and you may shower at that time.  You may have steri-strips (small skin tapes) in place directly over the incision.  These strips should be left on the skin for 7-10 days and will come off on their own.  If your surgeon used skin glue on the incision, you may shower in 24 hours.  The glue will flake off over the next 2-3 weeks.  Any sutures or staples will be removed at the office during your follow-up visit. 8. ACTIVITIES:  You may resume regular daily activities (gradually increasing) beginning the next day.  Wearing a good support bra or sports bra minimizes pain and swelling.  You may have sexual intercourse when it is comfortable. a. You may drive when you no longer are taking prescription pain medication, you can comfortably wear a seatbelt, and you can safely maneuver your car and apply brakes. b. RETURN TO WORK:  ______________________________________________________________________________________ 9. You should see your doctor in the office for a follow-up appointment approximately two weeks after your surgery.  Your doctor's nurse will typically make your follow-up appointment when she calls you with your pathology report.  Expect your pathology report 3-4 business days after your surgery.  You may call to check if you do not hear from us after three days. 10. OTHER INSTRUCTIONS: _______________________________________________________________________________________________ _____________________________________________________________________________________________________________________________________ _____________________________________________________________________________________________________________________________________ _____________________________________________________________________________________________________________________________________  WHEN TO CALL DR WAKEFIELD: 1. Fever over 101.0 2. Nausea and/or vomiting. 3. Extreme swelling or  bruising. 4. Continued bleeding from incision. 5. Increased pain, redness, or drainage from the incision.  The clinic   staff is available to answer your questions during regular business hours.  Please don't hesitate to call and ask to speak to one of the nurses for clinical concerns.  If you have a medical emergency, go to the nearest emergency room or call 911.  A surgeon from [facility] is always on call at the hospital.  For further questions, please visit [email or web address]   Post Anesthesia Home Care Instructions  Activity: Get plenty of rest for the remainder of the day. A responsible individual must stay with you for 24 hours following the procedure.  For the next 24 hours, DO NOT: -Drive a car -Paediatric nurse -Drink alcoholic beverages -Take any medication unless instructed by your physician -Make any legal decisions or sign important papers.  Meals: Start with liquid foods such as gelatin or soup. Progress to regular foods as tolerated. Avoid greasy, spicy, heavy foods. If nausea and/or vomiting occur, drink only clear liquids until the nausea and/or vomiting subsides. Call your physician if vomiting continues.  Special Instructions/Symptoms: Your throat may feel dry or sore from the anesthesia or the breathing tube placed in your throat during surgery. If this causes discomfort, gargle with warm salt water. The discomfort should disappear within 24 hours.  *May take Tylenol at 3:30pm today 04/01/20 *May take Ibuprofen at 5:30pm today 04/01/20

## 2020-04-01 NOTE — Anesthesia Preprocedure Evaluation (Signed)
Anesthesia Evaluation  Patient identified by MRN, date of birth, ID band Patient awake    Reviewed: Allergy & Precautions, NPO status , Patient's Chart, lab work & pertinent test results  History of Anesthesia Complications (+) PONV  Airway Mallampati: II  TM Distance: >3 FB Neck ROM: Full    Dental no notable dental hx.    Pulmonary neg pulmonary ROS, former smoker,    Pulmonary exam normal breath sounds clear to auscultation       Cardiovascular negative cardio ROS Normal cardiovascular exam Rhythm:Regular Rate:Normal     Neuro/Psych Anxiety negative neurological ROS  negative psych ROS   GI/Hepatic negative GI ROS, Neg liver ROS,   Endo/Other  negative endocrine ROS  Renal/GU negative Renal ROS  negative genitourinary   Musculoskeletal negative musculoskeletal ROS (+)   Abdominal   Peds negative pediatric ROS (+)  Hematology negative hematology ROS (+)   Anesthesia Other Findings   Reproductive/Obstetrics negative OB ROS                             Anesthesia Physical Anesthesia Plan  ASA: II  Anesthesia Plan: General   Post-op Pain Management:    Induction: Intravenous  PONV Risk Score and Plan: 4 or greater and Ondansetron, Dexamethasone, Midazolam, Droperidol and Treatment may vary due to age or medical condition  Airway Management Planned: LMA  Additional Equipment:   Intra-op Plan:   Post-operative Plan: Extubation in OR  Informed Consent: I have reviewed the patients History and Physical, chart, labs and discussed the procedure including the risks, benefits and alternatives for the proposed anesthesia with the patient or authorized representative who has indicated his/her understanding and acceptance.     Dental advisory given  Plan Discussed with: CRNA  Anesthesia Plan Comments:         Anesthesia Quick Evaluation

## 2020-04-01 NOTE — H&P (Addendum)
  44 yof with fh of ovarian cancer presents with left breast mass and right breast mass for over two years. no prior breast issues. no discharge. she followed up in January after undergoing initial evaluation in what appears to be 2018 when she noted left side had gotten larger. the right remains same to her. she had repeat mm that shows c density breasts. on mm there is right uoq mass with clip present. on the left there is a mass medially also with clip present. US of the right side shows this to be 2.6x1.4x2.4 cm that in 12/19 was 2.6x1.2x2 cm in size. there is an adjacent simple cyst as well. US of the left side shows a mass that is now 3.6x3.2x2.1 cm without left ax adenopathy. this previously in 12/19 was 2.6x1.5x1.8 cm in size. these appear both to be biopsied fibroadenomas. she is here to discuss options.   Past Surgical History  No pertinent past surgical history   Diagnostic Studies History  Colonoscopy  never Mammogram  within last year Pap Smear  1-5 years ago  Allergies  No Known Drug Allergies  Allergies Reconciled   Medication History  Escitalopram Oxalate (10MG  Tablet, Oral) Active. Rizatriptan Benzoate (5MG  Tablet, Oral) Active. Medications Reconciled  Social History  Alcohol use  Occasional alcohol use. Caffeine use  Carbonated beverages. No drug use  Tobacco use  Former smoker.  Family History  Kidney Disease  Brother. Ovarian Cancer  Mother. Respiratory Condition  Father.  Pregnancy / Birth History  Age at menarche  1 years. Contraceptive History  Oral contraceptives. Gravida  3 Length (months) of breastfeeding  7-12 Maternal age  67-25 Para  3 Regular periods   Other Problems Anxiety Disorder  Back Pain  Gastroesophageal Reflux Disease    Review of Systems  General Not Present- Appetite Loss, Chills, Fatigue, Fever, Night Sweats, Weight Gain and Weight Loss.   Physical Exam General Mental  Status-Alert. Orientation-Oriented X3. Breast Nipples-No Discharge. Note: large luiq breast mass mobile nontender right lateral breast mass about 2 cm mobile nontender Lymphatic Head & Neck General Head & Neck Lymphatics: Bilateral - Description - Normal. Axillary General Axillary Region: Bilateral - Description - Normal. Note: no [place] adenopathy   Assessment & Plan  BREAST MASS, LEFT (N63.20) Story: left breast mass excision we discussed excising this due to rapid growth. we discussed incision, appearance and postop course. wil proceed Impression: I spent 20 minutes reviewing imaging data, pathology and counselling/examining patient BREAST MASS, RIGHT (N63.10) Story: discussed observation vs excision, she would like to just observe this one for now and I think reasonable. she will need continued [place] follow up

## 2020-04-01 NOTE — Transfer of Care (Signed)
Immediate Anesthesia Transfer of Care Note  Patient: Mckenzie Harris  Procedure(s) Performed: LEFT BREAST MASS EXCISION (Left Breast)  Patient Location: PACU  Anesthesia Type:General  Level of Consciousness: awake, alert  and oriented  Airway & Oxygen Therapy: Patient Spontanous Breathing and Patient connected to nasal cannula oxygen  Post-op Assessment: Report given to RN and Post -op Vital signs reviewed and stable  Post vital signs: Reviewed and stable  Last Vitals:  Vitals Value Taken Time  BP 115/87   Temp    Pulse 78   Resp 12   SpO2 100     Last Pain:  Vitals:   04/01/20 0905  TempSrc: Oral  PainSc: 0-No pain      Patients Stated Pain Goal: 3 (35/32/99 2426)  Complications: No apparent anesthesia complications

## 2020-04-01 NOTE — Interval H&P Note (Signed)
History and Physical Interval Note:  04/01/2020 9:29 AM  Mckenzie Harris  has presented today for surgery, with the diagnosis of LEFT BREAST MASS.  The various methods of treatment have been discussed with the patient and family. After consideration of risks, benefits and other options for treatment, the patient has consented to  Procedure(s): LEFT BREAST MASS EXCISION (Left) as a surgical intervention.  The patient's history has been reviewed, patient examined, no change in status, stable for surgery.  I have reviewed the patient's chart and labs.  Questions were answered to the patient's satisfaction.     Rolm Bookbinder

## 2020-04-02 ENCOUNTER — Encounter: Payer: Self-pay | Admitting: *Deleted

## 2020-04-02 LAB — SURGICAL PATHOLOGY

## 2020-06-05 ENCOUNTER — Other Ambulatory Visit: Payer: Self-pay | Admitting: Family Medicine

## 2020-06-05 DIAGNOSIS — G8929 Other chronic pain: Secondary | ICD-10-CM

## 2020-06-05 DIAGNOSIS — R519 Headache, unspecified: Secondary | ICD-10-CM

## 2020-06-26 ENCOUNTER — Encounter: Payer: Self-pay | Admitting: Family Medicine

## 2020-06-26 ENCOUNTER — Other Ambulatory Visit: Payer: Self-pay | Admitting: Family Medicine

## 2020-06-26 DIAGNOSIS — F411 Generalized anxiety disorder: Secondary | ICD-10-CM

## 2020-06-26 DIAGNOSIS — F418 Other specified anxiety disorders: Secondary | ICD-10-CM

## 2020-06-26 MED ORDER — ESCITALOPRAM OXALATE 20 MG PO TABS: 20.0000 mg | ORAL_TABLET | Freq: Every day | ORAL | 2 refills | Status: DC

## 2020-07-30 ENCOUNTER — Other Ambulatory Visit: Payer: Self-pay | Admitting: Family Medicine

## 2020-07-30 DIAGNOSIS — F411 Generalized anxiety disorder: Secondary | ICD-10-CM

## 2020-08-29 ENCOUNTER — Encounter: Payer: Self-pay | Admitting: Family Medicine

## 2020-09-01 ENCOUNTER — Ambulatory Visit: Payer: 59 | Admitting: Family Medicine

## 2020-09-05 ENCOUNTER — Encounter: Payer: Self-pay | Admitting: Family Medicine

## 2020-09-05 ENCOUNTER — Other Ambulatory Visit: Payer: Self-pay

## 2020-09-05 ENCOUNTER — Ambulatory Visit (INDEPENDENT_AMBULATORY_CARE_PROVIDER_SITE_OTHER): Payer: 59 | Admitting: Family Medicine

## 2020-09-05 VITALS — BP 98/60 | HR 74 | Temp 98.0°F | Resp 18 | Ht 65.0 in | Wt 127.2 lb

## 2020-09-05 DIAGNOSIS — R42 Dizziness and giddiness: Secondary | ICD-10-CM | POA: Diagnosis not present

## 2020-09-05 DIAGNOSIS — F419 Anxiety disorder, unspecified: Secondary | ICD-10-CM | POA: Diagnosis not present

## 2020-09-05 DIAGNOSIS — Z23 Encounter for immunization: Secondary | ICD-10-CM | POA: Diagnosis not present

## 2020-09-05 DIAGNOSIS — R682 Dry mouth, unspecified: Secondary | ICD-10-CM | POA: Diagnosis not present

## 2020-09-05 DIAGNOSIS — R739 Hyperglycemia, unspecified: Secondary | ICD-10-CM

## 2020-09-05 LAB — CBC WITH DIFFERENTIAL/PLATELET
Basophils Absolute: 0 10*3/uL (ref 0.0–0.1)
Basophils Relative: 0.8 % (ref 0.0–3.0)
Eosinophils Absolute: 0.1 10*3/uL (ref 0.0–0.7)
Eosinophils Relative: 1 % (ref 0.0–5.0)
HCT: 40.8 % (ref 36.0–46.0)
Hemoglobin: 13.9 g/dL (ref 12.0–15.0)
Lymphocytes Relative: 26.4 % (ref 12.0–46.0)
Lymphs Abs: 1.5 10*3/uL (ref 0.7–4.0)
MCHC: 34.1 g/dL (ref 30.0–36.0)
MCV: 83.9 fl (ref 78.0–100.0)
Monocytes Absolute: 0.3 10*3/uL (ref 0.1–1.0)
Monocytes Relative: 5.7 % (ref 3.0–12.0)
Neutro Abs: 3.6 10*3/uL (ref 1.4–7.7)
Neutrophils Relative %: 66.1 % (ref 43.0–77.0)
Platelets: 268 10*3/uL (ref 150.0–400.0)
RBC: 4.87 Mil/uL (ref 3.87–5.11)
RDW: 14.3 % (ref 11.5–15.5)
WBC: 5.5 10*3/uL (ref 4.0–10.5)

## 2020-09-05 LAB — LIPID PANEL
Cholesterol: 200 mg/dL (ref 0–200)
HDL: 61.6 mg/dL (ref >39.00)
LDL Cholesterol: 120 mg/dL — ABNORMAL HIGH (ref 0–99)
NonHDL: 138.65
Total CHOL/HDL Ratio: 3
Triglycerides: 93 mg/dL (ref 0.0–149.0)
VLDL: 18.6 mg/dL (ref 0.0–40.0)

## 2020-09-05 LAB — COMPREHENSIVE METABOLIC PANEL
ALT: 17 U/L (ref 0–35)
AST: 17 U/L (ref 0–37)
Albumin: 4.4 g/dL (ref 3.5–5.2)
Alkaline Phosphatase: 61 U/L (ref 39–117)
BUN: 9 mg/dL (ref 6–23)
CO2: 31 mEq/L (ref 19–32)
Calcium: 9.1 mg/dL (ref 8.4–10.5)
Chloride: 103 mEq/L (ref 96–112)
Creatinine, Ser: 0.77 mg/dL (ref 0.40–1.20)
GFR: 92.51 mL/min (ref >60.00)
Glucose, Bld: 88 mg/dL (ref 70–99)
Potassium: 4.2 mEq/L (ref 3.5–5.1)
Sodium: 140 mEq/L (ref 135–145)
Total Bilirubin: 0.7 mg/dL (ref 0.2–1.2)
Total Protein: 6.8 g/dL (ref 6.0–8.3)

## 2020-09-05 LAB — HEMOGLOBIN A1C: Hgb A1c MFr Bld: 4.9 % (ref 4.6–6.5)

## 2020-09-05 LAB — VITAMIN B12: Vitamin B-12: 301 pg/mL (ref 211–911)

## 2020-09-05 MED ORDER — ESCITALOPRAM OXALATE 10 MG PO TABS: 10.0000 mg | ORAL_TABLET | Freq: Every day | ORAL | 3 refills | Status: DC

## 2020-09-05 NOTE — Assessment & Plan Note (Signed)
Cut lexapro in 1/2 daily F/u 3-6 months

## 2020-09-05 NOTE — Patient Instructions (Signed)
Hypoglycemia Hypoglycemia occurs when the level of sugar (glucose) in the blood is too low. Hypoglycemia can happen in people who do or do not have diabetes. It can develop quickly, and it can be a medical emergency. For most people with diabetes, a blood glucose level below 70 mg/dL (3.9 mmol/L) is considered hypoglycemia. Glucose is a type of sugar that provides the body's main source of energy. Certain hormones (insulin and glucagon) control the level of glucose in the blood. Insulin lowers blood glucose, and glucagon raises blood glucose. Hypoglycemia can result from having too much insulin in the bloodstream, or from not eating enough food that contains glucose. You may also have reactive hypoglycemia, which happens within 4 hours after eating a meal. What are the causes? Hypoglycemia occurs most often in people who have diabetes and may be caused by:  Diabetes medicine.  Not eating enough, or not eating often enough.  Increased physical activity.  Drinking alcohol on an empty stomach. If you do not have diabetes, hypoglycemia may be caused by:  A tumor in the pancreas.  Not eating enough, or not eating for long periods at a time (fasting).  A severe infection or illness.  Certain medicines. What increases the risk? Hypoglycemia is more likely to develop in:  People who have diabetes and take medicines to lower blood glucose.  People who abuse alcohol.  People who have a severe illness. What are the signs or symptoms? Mild symptoms Mild hypoglycemia may not cause any symptoms. If you do have symptoms, they may include:  Hunger.  Anxiety.  Sweating and feeling clammy.  Dizziness or feeling light-headed.  Sleepiness.  Nausea.  Increased heart rate.  Headache.  Blurry vision.  Irritability.  Tingling or numbness around the mouth, lips, or tongue.  A change in coordination.  Restless sleep. Moderate symptoms Moderate hypoglycemia can cause:  Mental  confusion and poor judgment.  Behavior changes.  Weakness.  Irregular heartbeat. Severe symptoms Severe hypoglycemia is a medical emergency. It can cause:  Fainting.  Seizures.  Loss of consciousness (coma).  Death. How is this diagnosed? Hypoglycemia is diagnosed with a blood test to measure your blood glucose level. This blood test is done while you are having symptoms. Your health care provider may also do a physical exam and review your medical history. How is this treated? This condition can often be treated by immediately eating or drinking something that contains sugar, such as:  Fruit juice, 4-6 oz (120-150 mL).  Regular soda (not diet soda), 4-6 oz (120-150 mL).  Low-fat milk, 4 oz (120 mL).  Several pieces of hard candy.  Sugar or honey, 1 Tbsp (15 mL). Treating hypoglycemia if you have diabetes If you are alert and able to swallow safely, follow the 15:15 rule:  Take 15 grams of a rapid-acting carbohydrate. Talk with your health care provider about how much you should take.  Rapid-acting options include: ? Glucose pills (take 15 grams). ? 6-8 pieces of hard candy. ? 4-6 oz (120-150 mL) of fruit juice. ? 4-6 oz (120-150 mL) of regular (not diet) soda. ? 1 Tbsp (15 mL) honey or sugar.  Check your blood glucose 15 minutes after you take the carbohydrate.  If the repeat blood glucose level is still at or below 70 mg/dL (3.9 mmol/L), take 15 grams of a carbohydrate again.  If your blood glucose level does not increase above 70 mg/dL (3.9 mmol/L) after 3 tries, seek emergency medical care.  After your blood glucose level returns   to normal, eat a meal or a snack within 1 hour.  Treating severe hypoglycemia Severe hypoglycemia is when your blood glucose level is at or below 54 mg/dL (3 mmol/L). Severe hypoglycemia is a medical emergency. Get medical help right away. If you have severe hypoglycemia and you cannot eat or drink, you may need an injection of  glucagon. A family member or close friend should learn how to check your blood glucose and how to give you a glucagon injection. Ask your health care provider if you need to have an emergency glucagon injection kit available. Severe hypoglycemia may need to be treated in a hospital. The treatment may include getting glucose through an IV. You may also need treatment for the cause of your hypoglycemia. Follow these instructions at home:  General instructions  Take over-the-counter and prescription medicines only as told by your health care provider.  Monitor your blood glucose as told by your health care provider.  Limit alcohol intake to no more than 1 drink a day for nonpregnant women and 2 drinks a day for men. One drink equals 12 oz of beer (355 mL), 5 oz of wine (148 mL), or 1 oz of hard liquor (44 mL).  Keep all follow-up visits as told by your health care provider. This is important. If you have diabetes:  Always have a rapid-acting carbohydrate snack with you to treat low blood glucose.  Follow your diabetes management plan as directed. Make sure you: ? Know the symptoms of hypoglycemia. It is important to treat it right away to prevent it from becoming severe. ? Take your medicines as directed. ? Follow your exercise plan. ? Follow your meal plan. Eat on time, and do not skip meals. ? Check your blood glucose as often as directed. Always check before and after exercise. ? Follow your sick day plan whenever you cannot eat or drink normally. Make this plan in advance with your health care provider.  Share your diabetes management plan with people in your workplace, school, and household.  Check your urine for ketones when you are ill and as told by your health care provider.  Carry a medical alert card or wear medical alert jewelry. Contact a health care provider if:  You have problems keeping your blood glucose in your target range.  You have frequent episodes of  hypoglycemia. Get help right away if:  You continue to have hypoglycemia symptoms after eating or drinking something containing glucose.  Your blood glucose is at or below 54 mg/dL (3 mmol/L).  You have a seizure.  You faint. These symptoms may represent a serious problem that is an emergency. Do not wait to see if the symptoms will go away. Get medical help right away. Call your local emergency services (911 in the [place] Summary  Hypoglycemia occurs when the level of sugar (glucose) in the blood is too low.  Hypoglycemia can happen in people who do or do not have diabetes. It can develop quickly, and it can be a medical emergency.  Make sure you know the symptoms of hypoglycemia and how to treat it.  Always have a rapid-acting carbohydrate snack with you to treat low blood sugar. This information is not intended to replace advice given to you by your health care provider. Make sure you discuss any questions you have with your health care provider. Document Revised: 04/03/2018 Document Reviewed: 11/14/2015 Elsevier Patient Education  2020 [employer]  

## 2020-09-05 NOTE — Progress Notes (Signed)
Patient ID: Mckenzie Harris, female    DOB: Feb 28, 1976  Age: 44 y.o. MRN: [id number]    Subjective:  Subjective  HPI Mckenzie Harris presents for c/o dizziness and dry mouth.  She suspects low blood sugar but is concerned about dm too.  She also c/o dry mouth and thinks its the lexapro so she cut the pills in half.   No cp, no sob or palpitation s   Review of Systems  Constitutional: Negative for appetite change, diaphoresis, fatigue and unexpected weight change.  Eyes: Negative for pain, redness and visual disturbance.  Respiratory: Negative for cough, chest tightness, shortness of breath and wheezing.   Cardiovascular: Negative for chest pain, palpitations and leg swelling.  Endocrine: Negative for cold intolerance, heat intolerance, polydipsia, polyphagia and polyuria.  Genitourinary: Negative for difficulty urinating, dysuria and frequency.  Neurological: Negative for dizziness, light-headedness, numbness and headaches.    History Past Medical History:  Diagnosis Date  . Anxiety   . HSV-2 infection   . PONV (postoperative nausea and vomiting)     She has a past surgical history that includes Breast biopsy; Wisdom tooth extraction; and Breast lumpectomy (Left, 04/01/2020).   Her family history includes Anxiety disorder in an other family member; Atrial fibrillation in her father; COPD in her father; Heart disease (age of onset: 70) in her father; Hyperlipidemia in her father; Hypertension in her father; Lung cancer in her father; Ovarian cancer in her mother.She reports that she has quit smoking. She smoked 0.00 packs per day for 20.00 years. She has never used smokeless tobacco. She reports current alcohol use. She reports that she does not use drugs.  Current Outpatient Medications on File Prior to Visit  Medication Sig Dispense Refill  . ALPRAZolam (XANAX) 0.25 MG tablet Take 1 tablet (0.25 mg total) by mouth at bedtime as needed for anxiety or sleep. 20 tablet 0  . rizatriptan  (MAXALT) 5 MG tablet TAKE 1 TABLET BY MOUTH AS NEEDED FOR MIGRAINE. MAY REPEAT IN 2 HOURS IF NEEDED. NEED OFFICE VISIT 12 tablet 2   No current facility-administered medications on file prior to visit.     Objective:  Objective  Physical Exam Vitals and nursing note reviewed.  Constitutional:      Appearance: She is well-developed.  HENT:     Head: Normocephalic and atraumatic.  Eyes:     Conjunctiva/sclera: Conjunctivae normal.  Neck:     Thyroid: No thyromegaly.     Vascular: No carotid bruit or JVD.  Cardiovascular:     Rate and Rhythm: Normal rate and regular rhythm.     Heart sounds: Normal heart sounds. No murmur heard.   Pulmonary:     Effort: Pulmonary effort is normal. No respiratory distress.     Breath sounds: Normal breath sounds. No wheezing or rales.  Chest:     Chest wall: No tenderness.  Musculoskeletal:     Cervical back: Normal range of motion and neck supple.  Neurological:     Mental Status: She is alert and oriented to person, place, and time.    BP 98/60 (BP Location: Right Arm, Patient Position: Sitting, Cuff Size: Normal)   Pulse 74   Temp 98 F (36.7 C) (Oral)   Resp 18   Ht 5\' 5"  (1.651 m)   Wt 127 lb 3.2 oz (57.7 kg)   SpO2 99%   BMI 21.17 kg/m  Wt Readings from Last 3 Encounters:  09/05/20 127 lb 3.2 oz (57.7 kg)  04/01/20 132 lb 15  oz (60.3 kg)  12/04/19 136 lb (61.7 kg)     Lab Results  Component Value Date   WBC 6.2 12/04/2019   HGB 13.2 12/04/2019   HCT 38.8 12/04/2019   PLT 247.0 12/04/2019   GLUCOSE 105 (H) 03/03/2020   CHOL 179 03/03/2020   TRIG 97.0 03/03/2020   HDL 54.40 03/03/2020   LDLCALC 105 (H) 03/03/2020   ALT 19 03/03/2020   AST 21 03/03/2020   NA 140 03/03/2020   K 4.2 03/03/2020   CL 104 03/03/2020   CREATININE 0.76 03/03/2020   BUN 8 03/03/2020   CO2 29 03/03/2020   TSH 2.90 03/03/2020    No results found.   Assessment & Plan:  Plan  I have discontinued Mckenzie Harris's escitalopram. I am also  having her start on escitalopram. Additionally, I am having her maintain her ALPRAZolam and rizatriptan.  Meds ordered this encounter  Medications  . escitalopram (LEXAPRO) 10 MG tablet    Sig: Take 1 tablet (10 mg total) by mouth daily.    Dispense:  90 tablet    Refill:  3    Problem List Items Addressed This Visit      Unprioritized   Dizziness - Primary    ? Hypoglycemia----- eat 5 small meals or 3 meals and 2 snacks daily  Check labs       Relevant Orders   Lipid panel   Vitamin B12   Dry mouth    Cut lexapro in 1/2 daily F/u 3-6 months       Other Visit Diagnoses    Need for influenza vaccination       Relevant Orders   Flu Vaccine QUAD 36+ mos IM (Completed)   Hyperglycemia       Relevant Orders   CBC with Differential/Platelet   Comprehensive metabolic panel   Hemoglobin A1c   Anxiety       Relevant Medications   escitalopram (LEXAPRO) 10 MG tablet      Follow-up: Return in about 6 months (around 03/05/2021), or if symptoms worsen or fail to improve, for annual exam, fasting.  Ann Held, DO

## 2020-09-05 NOTE — Assessment & Plan Note (Signed)
?   Hypoglycemia----- eat 5 small meals or 3 meals and 2 snacks daily  Check labs

## 2021-01-13 ENCOUNTER — Encounter: Payer: Self-pay | Admitting: Family Medicine

## 2021-01-13 DIAGNOSIS — F419 Anxiety disorder, unspecified: Secondary | ICD-10-CM

## 2021-01-13 MED ORDER — ALPRAZOLAM 0.25 MG PO TABS: 0.2500 mg | ORAL_TABLET | Freq: Every evening | ORAL | 0 refills | Status: DC | PRN

## 2021-01-13 NOTE — Addendum Note (Signed)
Addended by: Sanda Linger on: 01/13/2021 08:44 AM   Modules accepted: Orders

## 2021-01-13 NOTE — Telephone Encounter (Signed)
Requesting: Xanax Contract: N/A UDS: N/A Last OV: 09/05/2020 Next OV: N/A Last Refill: 12/04/2019, #20--0 RF Database:   Please advise

## 2021-01-15 ENCOUNTER — Other Ambulatory Visit: Payer: Self-pay

## 2021-01-15 ENCOUNTER — Ambulatory Visit (INDEPENDENT_AMBULATORY_CARE_PROVIDER_SITE_OTHER): Payer: 59 | Admitting: Family Medicine

## 2021-01-15 ENCOUNTER — Encounter: Payer: Self-pay | Admitting: Family Medicine

## 2021-01-15 VITALS — BP 100/74 | HR 69 | Temp 98.1°F | Resp 18 | Ht 65.0 in | Wt 134.0 lb

## 2021-01-15 DIAGNOSIS — F411 Generalized anxiety disorder: Secondary | ICD-10-CM

## 2021-01-15 DIAGNOSIS — F419 Anxiety disorder, unspecified: Secondary | ICD-10-CM | POA: Diagnosis not present

## 2021-01-15 DIAGNOSIS — R002 Palpitations: Secondary | ICD-10-CM

## 2021-01-15 DIAGNOSIS — Z79899 Other long term (current) drug therapy: Secondary | ICD-10-CM

## 2021-01-15 DIAGNOSIS — R079 Chest pain, unspecified: Secondary | ICD-10-CM | POA: Diagnosis not present

## 2021-01-15 DIAGNOSIS — Z8249 Family history of ischemic heart disease and other diseases of the circulatory system: Secondary | ICD-10-CM

## 2021-01-15 MED ORDER — ESCITALOPRAM OXALATE 10 MG PO TABS: 10.0000 mg | ORAL_TABLET | Freq: Every day | ORAL | 3 refills | Status: DC

## 2021-01-15 MED ORDER — ALPRAZOLAM 0.25 MG PO TABS: 0.2500 mg | ORAL_TABLET | Freq: Every evening | ORAL | 1 refills | Status: AC | PRN

## 2021-01-15 NOTE — Progress Notes (Signed)
Patient ID: Mckenzie Harris, female    DOB: [birth year]  Age: 45 y.o. MRN: [id number]    Subjective:  Subjective  HPI Mckenzie Harris presents for f/u anxiety and she needs refills on her meds.  She also c/o cp and palpitations and is concerned about her fam hx of heart disease  Palpitations come and go   She has also been very anxious but can not inc dose of lexapro.  That has not worked well for her.    Review of Systems  Constitutional: Negative for appetite change, diaphoresis, fatigue and unexpected weight change.  Eyes: Negative for pain, redness and visual disturbance.  Respiratory: Negative for cough, chest tightness, shortness of breath and wheezing.   Cardiovascular: Positive for chest pain and palpitations. Negative for leg swelling.  Endocrine: Negative for cold intolerance, heat intolerance, polydipsia, polyphagia and polyuria.  Genitourinary: Negative for difficulty urinating, dysuria and frequency.  Neurological: Negative for dizziness, light-headedness, numbness and headaches.  Psychiatric/Behavioral: Negative for confusion. The patient is nervous/anxious.     History Past Medical History:  Diagnosis Date  . Anxiety   . HSV-2 infection   . PONV (postoperative nausea and vomiting)     She has a past surgical history that includes Breast biopsy; Wisdom tooth extraction; and Breast lumpectomy (Left, 04/01/2020).   Her family history includes Anxiety disorder in an other family member; Atrial fibrillation in her father; COPD in her father; Heart attack in her maternal grandfather and maternal grandmother; Heart disease (age of onset: 31) in her father; Hyperlipidemia in her father; Hypertension in her father; Lung cancer in her father; Ovarian cancer in her mother.She reports that she has quit smoking. She smoked 0.00 packs per day for 20.00 years. She has never used smokeless tobacco. She reports current alcohol use. She reports that she does not use drugs.  Current Outpatient  Medications on File Prior to Visit  Medication Sig Dispense Refill  . Rimegepant Sulfate (NURTEC) 75 MG TBDP Nurtec ODT 75 mg disintegrating tablet  TAKE 1 TABLET BY MOUTH EVERY OTHER DAY FOR 30 DAYS     No current facility-administered medications on file prior to visit.     Objective:  Objective  Physical Exam Vitals and nursing note reviewed.  Constitutional:      Appearance: She is well-developed.  HENT:     Head: Normocephalic and atraumatic.  Eyes:     Conjunctiva/sclera: Conjunctivae normal.  Neck:     Thyroid: No thyromegaly.     Vascular: No carotid bruit or JVD.  Cardiovascular:     Rate and Rhythm: Normal rate and regular rhythm.     Heart sounds: Normal heart sounds. No murmur heard.   Pulmonary:     Effort: Pulmonary effort is normal. No respiratory distress.     Breath sounds: Normal breath sounds. No wheezing or rales.  Chest:     Chest wall: No tenderness.  Musculoskeletal:     Cervical back: Normal range of motion and neck supple.  Neurological:     Mental Status: She is alert and oriented to person, place, and time.  Psychiatric:        Attention and Perception: Attention and perception normal.        Mood and Affect: Mood is anxious and depressed.        Behavior: Behavior normal.        Thought Content: Thought content normal.        Cognition and Memory: Cognition and memory normal.    BP  100/74 (BP Location: Left Arm, Patient Position: Sitting, Cuff Size: Normal)   Pulse 69   Temp 98.1 F (36.7 C) (Oral)   Resp 18   Ht 5\' 5"  (1.651 m)   Wt 134 lb (60.8 kg)   SpO2 99%   BMI 22.30 kg/m  Wt Readings from Last 3 Encounters:  01/15/21 134 lb (60.8 kg)  09/05/20 127 lb 3.2 oz (57.7 kg)  04/01/20 132 lb 15 oz (60.3 kg)     Lab Results  Component Value Date   WBC 5.5 09/05/2020   HGB 13.9 09/05/2020   HCT 40.8 09/05/2020   PLT 268.0 09/05/2020   GLUCOSE 88 09/05/2020   CHOL 200 09/05/2020   TRIG 93.0 09/05/2020   HDL 61.60 09/05/2020    LDLCALC 120 (H) 09/05/2020   ALT 17 09/05/2020   AST 17 09/05/2020   NA 140 09/05/2020   K 4.2 09/05/2020   CL 103 09/05/2020   CREATININE 0.77 09/05/2020   BUN 9 09/05/2020   CO2 31 09/05/2020   TSH 2.90 03/03/2020   HGBA1C 4.9 09/05/2020    No results found.   Assessment & Plan:  Plan  I have discontinued Donnice Fermin's rizatriptan. I am also having her maintain her Nurtec, escitalopram, and ALPRAZolam.  Meds ordered this encounter  Medications  . escitalopram (LEXAPRO) 10 MG tablet    Sig: Take 1 tablet (10 mg total) by mouth daily.    Dispense:  90 tablet    Refill:  3  . ALPRAZolam (XANAX) 0.25 MG tablet    Sig: Take 1 tablet (0.25 mg total) by mouth at bedtime as needed for anxiety or sleep.    Dispense:  20 tablet    Refill:  1    Problem List Items Addressed This Visit      Unprioritized   Anxiety state    Con't lexapro  Refill xanax prob cause of palpitations but will refer to cardiology at pt request       Relevant Medications   escitalopram (LEXAPRO) 10 MG tablet   ALPRAZolam (XANAX) 0.25 MG tablet   Palpitations    Come and go ekg -- nsr , -- compared to 01/2016 no change       Relevant Orders   EKG 12-Lead (Completed)   Ambulatory referral to Cardiology   Lipid panel   Comprehensive metabolic panel   TSH    Other Visit Diagnoses    Anxiety    -  Primary   Relevant Medications   escitalopram (LEXAPRO) 10 MG tablet   ALPRAZolam (XANAX) 0.25 MG tablet   Chest pain, unspecified type       Relevant Orders   EKG 12-Lead (Completed)   Ambulatory referral to Cardiology   Lipid panel   Comprehensive metabolic panel   Family history of heart disease       Relevant Orders   Ambulatory referral to Cardiology   High risk medication use       Relevant Orders   DRUG MONITORING, PANEL 8 WITH CONFIRMATION, URINE      Follow-up: Return in about 6 months (around 07/18/2021), or if symptoms worsen or fail to improve, for annual exam,  fasting.  Ann Held, DO

## 2021-01-15 NOTE — Assessment & Plan Note (Signed)
Come and go ekg -- nsr , -- compared to 01/2016 no change

## 2021-01-15 NOTE — Patient Instructions (Signed)
[email or web address]">  Generalized Anxiety Disorder, Adult Generalized anxiety disorder (GAD) is a mental health condition. Unlike normal worries, anxiety related to GAD is not triggered by a specific event. These worries do not fade or get better with time. GAD interferes with relationships, work, and school. GAD symptoms can vary from mild to severe. People with severe GAD can have intense waves of anxiety with physical symptoms that are similar to panic attacks. What are the causes? The exact cause of GAD is not known, but the following are believed to have an impact:  Differences in natural brain chemicals.  Genes passed down from parents to children.  Differences in the way threats are perceived.  Development during childhood.  Personality. What increases the risk? The following factors may make you more likely to develop this condition:  Being female.  Having a family history of anxiety disorders.  Being very shy.  Experiencing very stressful life events, such as the death of a loved one.  Having a very stressful family environment. What are the signs or symptoms? People with GAD often worry excessively about many things in their lives, such as their health and family. Symptoms may also include:  Mental and emotional symptoms: ? Worrying excessively about natural disasters. ? Fear of being late. ? Difficulty concentrating. ? Fears that others are judging your performance.  Physical symptoms: ? Fatigue. ? Headaches, muscle tension, muscle twitches, trembling, or feeling shaky. ? Feeling like your heart is pounding or beating very fast. ? Feeling out of breath or like you cannot take a deep breath. ? Having trouble falling asleep or staying asleep, or experiencing restlessness. ? Sweating. ? Nausea, diarrhea, or irritable bowel syndrome (IBS).  Behavioral symptoms: ? Experiencing erratic moods or irritability. ? Avoidance of new situations. ? Avoidance of  people. ? Extreme difficulty making decisions. How is this diagnosed? This condition is diagnosed based on your symptoms and medical history. You will also have a physical exam. Your health care provider may perform tests to rule out other possible causes of your symptoms. To be diagnosed with GAD, a person must have anxiety that:  Is out of his or her control.  Affects several different aspects of his or her life, such as work and relationships.  Causes distress that makes him or her unable to take part in normal activities.  Includes at least three symptoms of GAD, such as restlessness, fatigue, trouble concentrating, irritability, muscle tension, or sleep problems. Before your health care provider can confirm a diagnosis of GAD, these symptoms must be present more days than they are not, and they must last for 6 months or longer. How is this treated? This condition may be treated with:  Medicine. Antidepressant medicine is usually prescribed for long-term daily control. Anti-anxiety medicines may be added in severe cases, especially when panic attacks occur.  Talk therapy (psychotherapy). Certain types of talk therapy can be helpful in treating GAD by providing support, education, and guidance. Options include: ? Cognitive behavioral therapy (CBT). People learn coping skills and self-calming techniques to ease their physical symptoms. They learn to identify unrealistic thoughts and behaviors and to replace them with more appropriate thoughts and behaviors. ? Acceptance and commitment therapy (ACT). This treatment teaches people how to be mindful as a way to cope with unwanted thoughts and feelings. ? Biofeedback. This process trains you to manage your body's response (physiological response) through breathing techniques and relaxation methods. You will work with a therapist while machines are used to monitor your physical   symptoms.  Stress management techniques. These include yoga,  meditation, and exercise. A mental health specialist can help determine which treatment is best for you. Some people see improvement with one type of therapy. However, other people require a combination of therapies.   Follow these instructions at home: Lifestyle  Maintain a consistent routine and schedule.  Anticipate stressful situations. Create a plan, and allow extra time to work with your plan.  Practice stress management or self-calming techniques that you have learned from your therapist or your health care provider. General instructions  Take over-the-counter and prescription medicines only as told by your health care provider.  Understand that you are likely to have setbacks. Accept this and be kind to yourself as you persist to take better care of yourself.  Recognize and accept your accomplishments, even if you judge them as small.  Keep all follow-up visits as told by your health care provider. This is important. Contact a health care provider if:  Your symptoms do not get better.  Your symptoms get worse.  You have signs of depression, such as: ? A persistently sad or irritable mood. ? Loss of enjoyment in activities that used to bring you joy. ? Change in weight or eating. ? Changes in sleeping habits. ? Avoiding friends or family members. ? Loss of energy for normal tasks. ? Feelings of guilt or worthlessness. Get help right away if:  You have serious thoughts about hurting yourself or others. If you ever feel like you may hurt yourself or others, or have thoughts about taking your own life, get help right away. Go to your nearest emergency department or:  Call your local emergency services (911 in the [place]  Call a suicide crisis helpline, such as the National Suicide Prevention Lifeline at 1-800-273-8255. This is open 24 hours a day in the [place]  Text the Crisis Text Line at 741741 (in the [place] Summary  Generalized anxiety disorder (GAD) is a mental  health condition that involves worry that is not triggered by a specific event.  People with GAD often worry excessively about many things in their lives, such as their health and family.  GAD may cause symptoms such as restlessness, trouble concentrating, sleep problems, frequent sweating, nausea, diarrhea, headaches, and trembling or muscle twitching.  A mental health specialist can help determine which treatment is best for you. Some people see improvement with one type of therapy. However, other people require a combination of therapies. This information is not intended to replace advice given to you by your health care provider. Make sure you discuss any questions you have with your health care provider. Document Revised: 08/01/2019 Document Reviewed: 08/01/2019 Elsevier Patient Education  2021 Elsevier Inc.  

## 2021-01-15 NOTE — Assessment & Plan Note (Signed)
Con't lexapro  Refill xanax prob cause of palpitations but will refer to cardiology at pt request

## 2021-01-16 ENCOUNTER — Other Ambulatory Visit: Payer: Self-pay | Admitting: Family Medicine

## 2021-01-16 DIAGNOSIS — E785 Hyperlipidemia, unspecified: Secondary | ICD-10-CM

## 2021-01-16 LAB — DRUG MONITORING, PANEL 8 WITH CONFIRMATION, URINE
6 Acetylmorphine: NEGATIVE ng/mL (ref <10)
Alcohol Metabolites: NEGATIVE ng/mL
Amphetamines: NEGATIVE ng/mL (ref <500)
Benzodiazepines: NEGATIVE ng/mL (ref <100)
Buprenorphine, Urine: NEGATIVE ng/mL (ref <5)
Cocaine Metabolite: NEGATIVE ng/mL (ref <150)
Creatinine: 22.6 mg/dL
MDMA: NEGATIVE ng/mL (ref <500)
Marijuana Metabolite: NEGATIVE ng/mL (ref <20)
Opiates: NEGATIVE ng/mL (ref <100)
Oxidant: NEGATIVE ug/mL
Oxycodone: NEGATIVE ng/mL (ref <100)
pH: 5.4 (ref 4.5–9.0)

## 2021-01-16 LAB — LIPID PANEL
Cholesterol: 204 mg/dL — ABNORMAL HIGH (ref 0–200)
HDL: 56 mg/dL (ref >39.00)
NonHDL: 148.14
Total CHOL/HDL Ratio: 4
Triglycerides: 235 mg/dL — ABNORMAL HIGH (ref 0.0–149.0)
VLDL: 47 mg/dL — ABNORMAL HIGH (ref 0.0–40.0)

## 2021-01-16 LAB — COMPREHENSIVE METABOLIC PANEL
ALT: 19 U/L (ref 0–35)
AST: 21 U/L (ref 0–37)
Albumin: 4.5 g/dL (ref 3.5–5.2)
Alkaline Phosphatase: 62 U/L (ref 39–117)
BUN: 9 mg/dL (ref 6–23)
CO2: 30 mEq/L (ref 19–32)
Calcium: 9.1 mg/dL (ref 8.4–10.5)
Chloride: 103 mEq/L (ref 96–112)
Creatinine, Ser: 0.69 mg/dL (ref 0.40–1.20)
GFR: 103.81 mL/min (ref >60.00)
Glucose, Bld: 98 mg/dL (ref 70–99)
Potassium: 4.2 mEq/L (ref 3.5–5.1)
Sodium: 141 mEq/L (ref 135–145)
Total Bilirubin: 0.4 mg/dL (ref 0.2–1.2)
Total Protein: 6.8 g/dL (ref 6.0–8.3)

## 2021-01-16 LAB — TSH: TSH: 1.98 u[IU]/mL (ref 0.35–4.50)

## 2021-01-16 LAB — LDL CHOLESTEROL, DIRECT: Direct LDL: 117 mg/dL

## 2021-01-16 LAB — DM TEMPLATE

## 2021-01-19 NOTE — Progress Notes (Signed)
Referring-Mckenzie Lowne Chase DO Reason for referral-chest pain and palpitations  HPI: 45 year old female for evaluation of chest pain and palpitations at request of Roma Schanz DO.  Nuclear study 2007 normal.  Patient states she has intermittent chest pain that can last 1 second in the time in the left breast area.  She also occasionally has pain in her left arm and shoulder associated with what she feels is anxiety.  She also can have palpitations both with anxiety and also at other times described as a skip.  She does not have significant dyspnea on exertion, orthopnea, PND, exertional chest pain or syncope.  Cardiology now asked to evaluate.  Current Outpatient Medications  Medication Sig Dispense Refill  . ALPRAZolam (XANAX) 0.25 MG tablet Take 1 tablet (0.25 mg total) by mouth at bedtime as needed for anxiety or sleep. 20 tablet 1  . escitalopram (LEXAPRO) 10 MG tablet Take 1 tablet (10 mg total) by mouth daily. 90 tablet 3  . Rimegepant Sulfate (NURTEC) 75 MG TBDP Nurtec ODT 75 mg disintegrating tablet  TAKE 1 TABLET BY MOUTH EVERY OTHER DAY FOR 30 DAYS     No current facility-administered medications for this visit.    Allergies  Allergen Reactions  . Other      Past Medical History:  Diagnosis Date  . Anxiety   . HSV-2 infection   . PONV (postoperative nausea and vomiting)     Past Surgical History:  Procedure Laterality Date  . BREAST BIOPSY    . BREAST LUMPECTOMY Left 04/01/2020   Procedure: LEFT BREAST MASS EXCISION;  Surgeon: Rolm Bookbinder, MD;  Location: [place];  Service: General;  Laterality: Left;  . WISDOM TOOTH EXTRACTION      Social History   Socioeconomic History  . Marital status: Married    Spouse name: Not on file  . Number of children: 3  . Years of education: Not on file  . Highest education level: Not on file  Occupational History  . Occupation: Print production planner  Tobacco Use  . Smoking status: Former Smoker     Packs/day: 0.00    Years: 20.00    Pack years: 0.00  . Smokeless tobacco: Never Used  Substance and Sexual Activity  . Alcohol use: Yes    Comment: occassionally  . Drug use: No  . Sexual activity: Not Currently  Other Topics Concern  . Not on file  Social History Narrative   Regular exercise: no   Caffeine use: diet coke daily   Social Determinants of Health   Financial Resource Strain: Not on file  Food Insecurity: Not on file  Transportation Needs: Not on file  Physical Activity: Not on file  Stress: Not on file  Social Connections: Not on file  Intimate Partner Violence: Not on file    Family History  Problem Relation Age of Onset  . Heart disease Father 44       heart attack   . Hyperlipidemia Father   . Hypertension Father   . Atrial fibrillation Father        ablation  . Lung cancer Father   . COPD Father   . Ovarian cancer Mother   . Anxiety disorder Other   . Heart attack Maternal Grandmother   . Heart attack Maternal Grandfather     ROS: no fevers or chills, productive cough, hemoptysis, dysphasia, odynophagia, melena, hematochezia, dysuria, hematuria, rash, seizure activity, orthopnea, PND, pedal edema, claudication. Remaining systems are negative.  Physical Exam:  Blood pressure (!) 94/56, pulse 63, height 5\' 5"  (1.651 m), weight 133 lb 12.8 oz (60.7 kg), SpO2 97 %.  General:  Well developed/well nourished in NAD Skin warm/dry Patient not depressed No peripheral clubbing Back-normal HEENT-normal/normal eyelids Neck supple/normal carotid upstroke bilaterally; no bruits; no JVD; no thyromegaly chest - CTA/ normal expansion CV - RRR/normal S1 and S2; no murmurs, rubs or gallops;  PMI nondisplaced Abdomen -NT/ND, no HSM, no mass, + bowel sounds, no bruit 2+ femoral pulses, no bruits Ext-no edema, chords, 2+ DP Neuro-grossly nonfocal  ECG -January 15, 2021-normal sinus rhythm with no ST changes.  Personally reviewed  A/P  1 palpitations-sound  likely to be PACs or PVCs.  She has an apple watch and we have asked her to send the strips when she has her symptoms.  2 chest pain-symptoms are atypical.  We will arrange an echocardiogram to assess LV function and wall motion.  She also has a family history of coronary disease and is very concerned about that possibility.  We will arrange a calcium score for risk stratification.  Note electrocardiogram showed no ST changes.  3 history of anxiety-follow-up primary care.  Kirk Ruths, MD

## 2021-01-21 ENCOUNTER — Other Ambulatory Visit: Payer: Self-pay

## 2021-01-21 ENCOUNTER — Encounter: Payer: Self-pay | Admitting: Cardiology

## 2021-01-21 ENCOUNTER — Ambulatory Visit (INDEPENDENT_AMBULATORY_CARE_PROVIDER_SITE_OTHER): Payer: 59 | Admitting: Cardiology

## 2021-01-21 VITALS — BP 94/56 | HR 63 | Ht 65.0 in | Wt 133.8 lb

## 2021-01-21 DIAGNOSIS — F411 Generalized anxiety disorder: Secondary | ICD-10-CM | POA: Diagnosis not present

## 2021-01-21 DIAGNOSIS — R072 Precordial pain: Secondary | ICD-10-CM

## 2021-01-21 DIAGNOSIS — R002 Palpitations: Secondary | ICD-10-CM

## 2021-01-21 NOTE — Patient Instructions (Signed)
  Testing/Procedures:  Your physician has requested that you have an echocardiogram. Echocardiography is a painless test that uses sound waves to create images of your heart. It provides your doctor with information about the size and shape of your heart and how well your heart's chambers and valves are working. This procedure takes approximately one hour. There are no restrictions for this procedure.[place] [facility]     Follow-Up: At [facility], you and your health needs are our priority.  As part of our continuing mission to provide you with exceptional heart care, we have created designated Provider Care Teams.  These Care Teams include your primary Cardiologist (physician) and Advanced Practice Providers (APPs -  Physician Assistants and Nurse Practitioners) who all work together to provide you with the care you need, when you need it.  We recommend signing up for the patient portal called "MyChart".  Sign up information is provided on this After Visit Summary.  MyChart is used to connect with patients for Virtual Visits (Telemedicine).  Patients are able to view lab/test results, encounter notes, upcoming appointments, etc.  Non-urgent messages can be sent to your provider as well.   To learn more about what you can do with MyChart, go to [email or web address].    Your next appointment:   6 month(s)  The format for your next appointment:   In Person  Provider:   Kirk Ruths, MD

## 2021-02-16 ENCOUNTER — Encounter: Payer: Self-pay | Admitting: Family Medicine

## 2021-02-16 ENCOUNTER — Other Ambulatory Visit: Payer: Self-pay

## 2021-02-16 DIAGNOSIS — G43809 Other migraine, not intractable, without status migrainosus: Secondary | ICD-10-CM

## 2021-02-16 MED ORDER — NURTEC 75 MG PO TBDP: ORAL_TABLET | ORAL | 2 refills | Status: DC

## 2021-02-17 ENCOUNTER — Encounter: Payer: Self-pay | Admitting: Neurology

## 2021-02-20 ENCOUNTER — Other Ambulatory Visit: Payer: Self-pay

## 2021-02-20 ENCOUNTER — Ambulatory Visit (HOSPITAL_COMMUNITY): Payer: 59 | Attending: Cardiovascular Disease

## 2021-02-20 ENCOUNTER — Ambulatory Visit (INDEPENDENT_AMBULATORY_CARE_PROVIDER_SITE_OTHER)
Admission: RE | Admit: 2021-02-20 | Discharge: 2021-02-20 | Disposition: A | Discharge status: Home / Self Care | Payer: Self-pay | Source: Ambulatory Visit | Attending: Cardiology | Admitting: Cardiology

## 2021-02-20 DIAGNOSIS — R072 Precordial pain: Secondary | ICD-10-CM

## 2021-02-20 DIAGNOSIS — R002 Palpitations: Secondary | ICD-10-CM

## 2021-02-20 LAB — ECHOCARDIOGRAM COMPLETE
Area-P 1/2: 2.48 cm2
S' Lateral: 2.7 cm

## 2021-03-20 ENCOUNTER — Encounter: Payer: Self-pay | Admitting: Family Medicine

## 2021-03-20 NOTE — Telephone Encounter (Signed)
What is the other med--- it is not on her list anymore? She can also try the nurtec daily if she is having them more often

## 2021-03-20 NOTE — Telephone Encounter (Signed)
Pt has rizatriptan (MAXALT) 5 MG tablet in her history.

## 2021-03-20 NOTE — Telephone Encounter (Signed)
She can take a maxalt  She may want to take a nurtec daily and see if it prevents them if she is having them more often

## 2021-04-23 NOTE — Progress Notes (Signed)
NEUROLOGY CONSULTATION NOTE  Mckenzie Harris MRN: [id number] DOB: 1976/08/12  Referring provider: Roma Schanz, DO Primary care provider: Roma Schanz, DO  Reason for consult:  migraines  Assessment/Plan:   Migraine without aura Intermittent right sided facial numbness  Due to right sided facial numbness, check MRI of brain with and without contrast Migraine prevention:  start topiramate 25mg  at bedtime for one week, then 50mg  at bedtime Migraine rescue:  stop Nurtec.  Start Maxalt 5mg  (previously effective) Limit use of pain relievers to no more than 2 days out of week to prevent risk of rebound or medication-overuse headache. Keep headache diary Follow up 6 months    Subjective:  Mckenzie Harris is a 45 year old right-handed female who presents for migraines.  History supplemented by cardiology and referring provider's notes.  Onset:  around 46 years old Location:  across the occipital region and may become diffuse Quality:  "it just hurts" Intensity:  severe Aura:  no Prodrome:  no Associated symptoms:  Photophobia, phonophobia, osmophobia.  More recently started feeling nauseous.  She denies associated unilateral numbness or weakness. Duration:  hour with rizatriptan, otherwise several hours Frequency:  2 days a week on average. Sometimes wakes up in the middle of the night with them. Frequency of abortive medication: Nurtec twice a week Triggers:  strong odors Relieving factors:  sleep Activity:  aggravates  Of note, she says that her nose starts feeling cold. Also every now and then she notes sensation of tingling on the right side of her head and face  Current NSAIDS/analgesics:  none Current triptans:  none Current ergotamine:  none Current anti-emetic:  none Current muscle relaxants:  none Current Antihypertensive medications:  none Current Antidepressant medications:  Lexapro 10mg  QD Current Anticonvulsant medications:  none Current anti-CGRP:   Nurtec 75mg  as needed Current Vitamins/Herbal/Supplements:  none Current Antihistamines/Decongestants:  none Other therapy:  none Hormone/birth control:  none Other medications:  alprazolam  Past NSAIDS/analgesics:  ibuprofen, naproxen, Tylenol Past abortive triptans:  rizatriptan  Past abortive ergotamine:  none Past muscle relaxants:  Flexeril Past anti-emetic:  none Past antihypertensive medications:  none.  Contraindicated due to baseline low blood pressure Past antidepressant medications:  Paxil Past anticonvulsant medications:  none Past anti-CGRP:  none Past vitamins/Herbal/Supplements:  none Past antihistamines/decongestants:  none Other past therapies:  none  Caffeine:  1 cup coffee daily.  1 Dr. Malachi Bonds daily Diet:  3 water bottles a day of water.   Exercise:  routine Depression:  Yes; Anxiety:  Yes Other pain:  none Sleep hygiene:  good Family history of headache:  no  She has also been evaluated by cardiology for atypical chest pain and palpitations.  Workup, including ECG and echo, were normal.     PAST MEDICAL HISTORY: Past Medical History:  Diagnosis Date   Anxiety    HSV-2 infection    PONV (postoperative nausea and vomiting)     PAST SURGICAL HISTORY: Past Surgical History:  Procedure Laterality Date   BREAST BIOPSY     BREAST LUMPECTOMY Left 04/01/2020   Procedure: LEFT BREAST MASS EXCISION;  Surgeon: Rolm Bookbinder, MD;  Location: [place];  Service: General;  Laterality: Left;   WISDOM TOOTH EXTRACTION      MEDICATIONS: Current Outpatient Medications on File Prior to Visit  Medication Sig Dispense Refill   ALPRAZolam (XANAX) 0.25 MG tablet Take 1 tablet (0.25 mg total) by mouth at bedtime as needed for anxiety or sleep. 20 tablet 1  escitalopram (LEXAPRO) 10 MG tablet Take 1 tablet (10 mg total) by mouth daily. 90 tablet 3   Rimegepant Sulfate (NURTEC) 75 MG TBDP Nurtec ODT 75 mg disintegrating tablet  TAKE 1 TABLET BY  MOUTH EVERY OTHER DAY FOR 30 DAYS 30 tablet 2   No current facility-administered medications on file prior to visit.    ALLERGIES: Allergies  Allergen Reactions   Other     FAMILY HISTORY: Family History  Problem Relation Age of Onset   Heart disease Father 102       heart attack    Hyperlipidemia Father    Hypertension Father    Atrial fibrillation Father        ablation   Lung cancer Father    COPD Father    Ovarian cancer Mother    Anxiety disorder Other    Heart attack Maternal Grandmother    Heart attack Maternal Grandfather     Objective:  Blood pressure (!) 95/59, pulse 71, height 5\' 5"  (1.651 m), weight 131 lb 6.4 oz (59.6 kg), SpO2 99 %. General: No acute distress.  Patient appears well-groomed.   Head:  Normocephalic/atraumatic Eyes:  fundi examined but not visualized Neck: supple, no paraspinal tenderness, full range of motion Back: No paraspinal tenderness Heart: regular rate and rhythm Lungs: Clear to auscultation bilaterally. Vascular: No carotid bruits. Neurological Exam: Mental status: alert and oriented to person, place, and time, recent and remote memory intact, fund of knowledge intact, attention and concentration intact, speech fluent and not dysarthric, language intact. Cranial nerves: CN I: not tested CN II: pupils equal, round and reactive to light, visual fields intact CN III, IV, VI:  full range of motion, no nystagmus, no ptosis CN V: facial sensation intact. CN VII: upper and lower face symmetric CN VIII: hearing intact CN IX, X: gag intact, uvula midline CN XI: sternocleidomastoid and trapezius muscles intact CN XII: tongue midline Bulk & Tone: normal, no fasciculations. Motor:  muscle strength 5/5 throughout Sensation:  Pinprick, temperature and vibratory sensation intact. Deep Tendon Reflexes:  2+ throughout,  toes downgoing.   Finger to nose testing:  Without dysmetria.   Heel to shin:  Without dysmetria.   Gait:  Normal station  and stride.  Romberg negative.    Thank you for allowing me to take part in the care of this patient.  Metta Clines, DO  CC: Roma Schanz, DO

## 2021-04-24 ENCOUNTER — Ambulatory Visit (INDEPENDENT_AMBULATORY_CARE_PROVIDER_SITE_OTHER): Payer: 59 | Admitting: Neurology

## 2021-04-24 ENCOUNTER — Other Ambulatory Visit: Payer: Self-pay

## 2021-04-24 ENCOUNTER — Encounter: Payer: Self-pay | Admitting: Neurology

## 2021-04-24 VITALS — BP 95/59 | HR 71 | Ht 65.0 in | Wt 131.4 lb

## 2021-04-24 DIAGNOSIS — G43009 Migraine without aura, not intractable, without status migrainosus: Secondary | ICD-10-CM

## 2021-04-24 DIAGNOSIS — G8929 Other chronic pain: Secondary | ICD-10-CM | POA: Diagnosis not present

## 2021-04-24 DIAGNOSIS — R200 Anesthesia of skin: Secondary | ICD-10-CM | POA: Diagnosis not present

## 2021-04-24 DIAGNOSIS — R519 Headache, unspecified: Secondary | ICD-10-CM

## 2021-04-24 MED ORDER — TOPIRAMATE 50 MG PO TABS: ORAL_TABLET | ORAL | 0 refills | Status: DC

## 2021-04-24 MED ORDER — RIZATRIPTAN BENZOATE 5 MG PO TABS: 5.0000 mg | ORAL_TABLET | ORAL | 5 refills | Status: DC | PRN

## 2021-04-24 NOTE — Patient Instructions (Addendum)
  MRI of brain with and without contrast Start topiramate 25mg  at bedtime for one week, then increase to 50mg  at bedtime Take rizatriptan 10mg  at earliest onset of headache.  May repeat dose once in 2 hours if needed.  Maximum 2 tablets in 24 hours. Limit use of pain relievers to no more than 2 days out of the week.  These medications include acetaminophen, NSAIDs (ibuprofen/Advil/Motrin, naproxen/Aleve, triptans (Imitrex/sumatriptan), Excedrin, and narcotics.  This will help reduce risk of rebound headaches. Be aware of common food triggers:  - Caffeine:  coffee, black tea, cola, Mt. Dew  - Chocolate  - Dairy:  aged cheeses (brie, blue, cheddar, gouda, [place], provolone, [place], Swiss, etc), chocolate milk, buttermilk, sour cream, limit eggs and yogurt  - Nuts, peanut butter  - Alcohol  - Cereals/grains:  FRESH breads (fresh bagels, sourdough, doughnuts), yeast productions  - Processed/canned/aged/cured meats (pre-packaged deli meats, hotdogs)  - MSG/glutamate:  soy sauce, flavor enhancer, pickled/preserved/marinated foods  - Sweeteners:  aspartame (Equal, Nutrasweet).  Sugar and Splenda are okay  - Vegetables:  legumes (lima beans, lentils, snow peas, fava beans, pinto peans, peas, garbanzo beans), sauerkraut, onions, olives, pickles  - Fruit:  avocados, bananas, citrus fruit (orange, lemon, grapefruit), mango  - Other:  Frozen meals, macaroni and cheese Routine exercise Stay adequately hydrated (aim for 64 oz water daily) Keep headache diary Maintain proper stress management Maintain proper sleep hygiene Do not skip meals Consider supplements:  magnesium citrate 400mg  daily, riboflavin 400mg  daily, coenzyme Q10 100mg  three times daily.

## 2021-05-21 ENCOUNTER — Other Ambulatory Visit: Payer: Self-pay | Admitting: Neurology

## 2021-05-25 ENCOUNTER — Ambulatory Visit
Admission: RE | Admit: 2021-05-25 | Discharge: 2021-05-25 | Disposition: A | Discharge status: Home / Self Care | Payer: 59 | Source: Ambulatory Visit | Attending: Neurology | Admitting: Neurology

## 2021-05-25 ENCOUNTER — Other Ambulatory Visit: Payer: Self-pay

## 2021-05-25 DIAGNOSIS — R200 Anesthesia of skin: Secondary | ICD-10-CM

## 2021-05-25 MED ORDER — GADOBENATE DIMEGLUMINE 529 MG/ML IV SOLN
12.0000 mL | Freq: Once | INTRAVENOUS | Status: AC | PRN
  Administered 2021-05-25: 12 mL via INTRAVENOUS

## 2021-05-28 NOTE — Progress Notes (Signed)
No answer. LMOvm.

## 2021-06-02 ENCOUNTER — Telehealth: Payer: Self-pay | Admitting: Neurology

## 2021-06-02 DIAGNOSIS — R200 Anesthesia of skin: Secondary | ICD-10-CM

## 2021-06-02 NOTE — Telephone Encounter (Signed)
Pt states she is returning a call regarding her MRI. Please call back [phone]

## 2021-06-03 NOTE — Telephone Encounter (Signed)
Called patient and informed hr of Results from MRI: MRI of the trigeminal nerve is normal.  However, I had wanted MRI of the brain.  The MRI of the trigeminal nerve shows some of the brain but not the entirebrain.  I would like MRI of brain with and without contrast   Patient wanted to ask Dr. Tomi Likens what the 2.5 cm mucous retention cyst is? She was wondering if that could cause the symptoms she is having?  Patient stated that she is Ok with going forward with the MRI of the brain w/ and w/o contrast but she wanted to know why they didn't get all of her brain when she just had a MRI of the brain w/ w/o contrast? I informed patient that it looks like they did a MRI FACE TRIGEMINAL WITHOUT AND WITH CONTRAST.  Patient is aware that Dr. Tomi Likens is out of office for the week and she is ok with waiting for a response from him when he returns.

## 2021-06-10 NOTE — Telephone Encounter (Signed)
Called patient and informed her that The cyst is incidental. It is a cyst in one of her sinuses - benign and not causing any symptoms. Patient also wanted to know why she needed another MRI of her brain. I informed patient that due to the numbness in her face it looks like the order was switched to a Trigeminal MRI of her face and now Dr. Tomi Likens would like a full brain scan. Informed patient that I will send in her MRI order and someone from [facility] will give her a call. Patient had no further questions or concerns.

## 2021-06-30 ENCOUNTER — Inpatient Hospital Stay: Admission: RE | Admit: 2021-06-30 | Payer: 59 | Source: Ambulatory Visit

## 2021-07-03 NOTE — Progress Notes (Deleted)
HPI: FU CP and palpitations. Nuclear study 2007 normal.  Echocardiogram April 2022 showed normal LV function.  Calcium score April 2022 0.  Since last seen  Current Outpatient Medications  Medication Sig Dispense Refill   ALPRAZolam (XANAX) 0.25 MG tablet Take 1 tablet (0.25 mg total) by mouth at bedtime as needed for anxiety or sleep. 20 tablet 1   escitalopram (LEXAPRO) 10 MG tablet Take 1 tablet (10 mg total) by mouth daily. 90 tablet 3   rizatriptan (MAXALT) 5 MG tablet Take 1 tablet (5 mg total) by mouth as needed for migraine. May repeat in 2 hours if needed 10 tablet 5   topiramate (TOPAMAX) 50 MG tablet 1 tablet at bedtime 30 tablet 5   No current facility-administered medications for this visit.     Past Medical History:  Diagnosis Date   Anxiety    HSV-2 infection    PONV (postoperative nausea and vomiting)     Past Surgical History:  Procedure Laterality Date   BREAST BIOPSY     BREAST LUMPECTOMY Left 04/01/2020   Procedure: LEFT BREAST MASS EXCISION;  Surgeon: Rolm Bookbinder, MD;  Location: [place] [facility];  Service: General;  Laterality: Left;   WISDOM TOOTH EXTRACTION      Social History   Socioeconomic History   Marital status: Married    Spouse name: Not on file   Number of children: 3   Years of education: Not on file   Highest education level: Not on file  Occupational History   Occupation: Preschool Teacher  Tobacco Use   Smoking status: Former    Packs/day: 0.00    Years: 20.00    Pack years: 0.00    Types: Cigarettes   Smokeless tobacco: Never  Substance and Sexual Activity   Alcohol use: Yes    Comment: occassionally   Drug use: No   Sexual activity: Not Currently  Other Topics Concern   Not on file  Social History Narrative   Regular exercise: noCaffeine use: diet coke daily   Right handed   Social Determinants of Health   Financial Resource Strain: Not on file  Food Insecurity: Not on file  Transportation Needs:  Not on file  Physical Activity: Not on file  Stress: Not on file  Social Connections: Not on file  Intimate Partner Violence: Not on file    Family History  Problem Relation Age of Onset   Heart disease Father 43       heart attack    Hyperlipidemia Father    Hypertension Father    Atrial fibrillation Father        ablation   Lung cancer Father    COPD Father    Ovarian cancer Mother    Anxiety disorder Other    Heart attack Maternal Grandmother    Heart attack Maternal Grandfather     ROS: no fevers or chills, productive cough, hemoptysis, dysphasia, odynophagia, melena, hematochezia, dysuria, hematuria, rash, seizure activity, orthopnea, PND, pedal edema, claudication. Remaining systems are negative.  Physical Exam: Well-developed well-nourished in no acute distress.  Skin is warm and dry.  HEENT is normal.  Neck is supple.  Chest is clear to auscultation with normal expansion.  Cardiovascular exam is regular rate and rhythm.  Abdominal exam nontender or distended. No masses palpated. Extremities show no edema. neuro grossly intact  ECG- personally reviewed  A/P  1 palpitations-this is felt to be secondary to PACs or PVCs.  She will continue to  send strips associated with any symptoms from her apple watch.  2 chest pain-previous symptoms atypical.  Echocardiogram showed normal LV function and calcium score 0.  Electrocardiogram without ST changes.  No recurrent symptoms.  We will not pursue further evaluation.  3 anxiety-Per primary care.  Kirk Ruths, MD

## 2021-07-08 ENCOUNTER — Ambulatory Visit: Payer: 59 | Admitting: Cardiology

## 2021-07-15 ENCOUNTER — Other Ambulatory Visit: Payer: Self-pay

## 2021-07-15 ENCOUNTER — Ambulatory Visit
Admission: RE | Admit: 2021-07-15 | Discharge: 2021-07-15 | Disposition: A | Discharge status: Home / Self Care | Payer: 59 | Source: Ambulatory Visit | Attending: Neurology | Admitting: Neurology

## 2021-07-15 DIAGNOSIS — R200 Anesthesia of skin: Secondary | ICD-10-CM

## 2021-07-15 MED ORDER — GADOBENATE DIMEGLUMINE 529 MG/ML IV SOLN
12.0000 mL | Freq: Once | INTRAVENOUS | Status: AC | PRN
  Administered 2021-07-15: 12 mL via INTRAVENOUS

## 2021-07-17 NOTE — Progress Notes (Signed)
Tried calling pt, No answer. LMOVM 

## 2021-11-04 ENCOUNTER — Ambulatory Visit: Payer: 59 | Admitting: Neurology

## 2021-11-19 ENCOUNTER — Encounter: Payer: Self-pay | Admitting: Family Medicine

## 2021-11-24 ENCOUNTER — Ambulatory Visit (INDEPENDENT_AMBULATORY_CARE_PROVIDER_SITE_OTHER): Payer: 59 | Admitting: Family Medicine

## 2021-11-24 ENCOUNTER — Encounter: Payer: Self-pay | Admitting: Family Medicine

## 2021-11-24 VITALS — BP 100/70 | HR 96 | Temp 98.2°F | Resp 16 | Ht 65.0 in | Wt 135.0 lb

## 2021-11-24 DIAGNOSIS — G8929 Other chronic pain: Secondary | ICD-10-CM | POA: Diagnosis not present

## 2021-11-24 DIAGNOSIS — Z1159 Encounter for screening for other viral diseases: Secondary | ICD-10-CM | POA: Diagnosis not present

## 2021-11-24 DIAGNOSIS — Z0000 Encounter for general adult medical examination without abnormal findings: Secondary | ICD-10-CM

## 2021-11-24 DIAGNOSIS — R519 Headache, unspecified: Secondary | ICD-10-CM

## 2021-11-24 DIAGNOSIS — Z23 Encounter for immunization: Secondary | ICD-10-CM

## 2021-11-24 DIAGNOSIS — Z87891 Personal history of nicotine dependence: Secondary | ICD-10-CM

## 2021-11-24 DIAGNOSIS — Z1211 Encounter for screening for malignant neoplasm of colon: Secondary | ICD-10-CM | POA: Diagnosis not present

## 2021-11-24 MED ORDER — RIZATRIPTAN BENZOATE 5 MG PO TABS: 5.0000 mg | ORAL_TABLET | ORAL | 5 refills | Status: DC | PRN

## 2021-11-24 NOTE — Patient Instructions (Signed)

## 2021-11-24 NOTE — Progress Notes (Signed)
Subjective:     Mckenzie Harris is a 46 y.o. female and is here for a comprehensive physical exam. The patient reports no problems. She would like lung cancer screening--- her dad passed away from lung cancer and his parents both had lung cancer.  Pt still smokes occasionally  Social History   Socioeconomic History   Marital status: Married    Spouse name: Not on file   Number of children: 3   Years of education: Not on file   Highest education level: Not on file  Occupational History   Occupation: Preschool Teacher  Tobacco Use   Smoking status: Some Days    Packs/day: 0.50    Years: 30.00    Pack years: 15.00    Types: Cigarettes   Smokeless tobacco: Never  Substance and Sexual Activity   Alcohol use: Yes    Comment: occassionally   Drug use: No   Sexual activity: Not Currently  Other Topics Concern   Not on file  Social History Narrative   Regular exercise: noCaffeine use: diet coke daily   Right handed   Social Determinants of Health   Financial Resource Strain: Not on file  Food Insecurity: Not on file  Transportation Needs: Not on file  Physical Activity: Not on file  Stress: Not on file  Social Connections: Not on file  Intimate Partner Violence: Not on file   Health Maintenance  Topic Date Due   COVID-19 Vaccine (1) Never done   Hepatitis C Screening  Never done   TETANUS/TDAP  10/21/2018   COLONOSCOPY (Pts 45-10yrs Insurance coverage will need to be confirmed)  Never done   PAP SMEAR-Modifier  11/19/2022   INFLUENZA VACCINE  Completed   HIV Screening  Completed   HPV VACCINES  Aged Out    The following portions of the patient's history were reviewed and updated as appropriate: She  has a past medical history of Anxiety, HSV-2 infection, and PONV (postoperative nausea and vomiting). She does not have any pertinent problems on file. She  has a past surgical history that includes Breast biopsy; Wisdom tooth extraction; and Breast lumpectomy (Left,  04/01/2020). Her family history includes Anxiety disorder in an other family member; Atrial fibrillation in her father; COPD in her father; Heart attack in her maternal grandfather and maternal grandmother; Heart disease (age of onset: 24) in her father; Hyperlipidemia in her father; Hypertension in her father; Lung cancer in her father; Ovarian cancer in her mother. She  reports that she has been smoking cigarettes. She has a 30.00 pack-year smoking history. She has never used smokeless tobacco. She reports current alcohol use. She reports that she does not use drugs. She has a current medication list which includes the following prescription(s): alprazolam, escitalopram, rizatriptan, and topiramate. Current Outpatient Medications on File Prior to Visit  Medication Sig Dispense Refill   ALPRAZolam (XANAX) 0.25 MG tablet Take 1 tablet (0.25 mg total) by mouth at bedtime as needed for anxiety or sleep. 20 tablet 1   escitalopram (LEXAPRO) 10 MG tablet Take 1 tablet (10 mg total) by mouth daily. 90 tablet 3   topiramate (TOPAMAX) 50 MG tablet 1 tablet at bedtime 30 tablet 5   No current facility-administered medications on file prior to visit.   She is allergic to other..  Review of Systems Review of Systems  Constitutional: Negative for activity change, appetite change and fatigue.  HENT: Negative for hearing loss, congestion, tinnitus and ear discharge.  dentist q14m Eyes: Negative for visual disturbance (  see optho q1y -- vision corrected to 20/20 with glasses).  Respiratory: Negative for cough, chest tightness and shortness of breath.   Cardiovascular: Negative for chest pain, palpitations and leg swelling.  Gastrointestinal: Negative for abdominal pain, diarrhea, constipation and abdominal distention.  Genitourinary: Negative for urgency, frequency, decreased urine volume and difficulty urinating.  Musculoskeletal: Negative for back pain, arthralgias and gait problem.  Skin: Negative for color  change, pallor and rash.  Neurological: Negative for dizziness, light-headedness, numbness and headaches.  Hematological: Negative for adenopathy. Does not bruise/bleed easily.  Psychiatric/Behavioral: Negative for suicidal ideas, confusion, sleep disturbance, self-injury, dysphoric mood, decreased concentration and agitation.      Objective:    BP 100/70 (BP Location: Right Arm, Patient Position: Sitting, Cuff Size: Small)    Pulse 96    Temp 98.2 F (36.8 C) (Oral)    Resp 16    Ht 5\' 5"  (1.651 m)    Wt 135 lb (61.2 kg)    SpO2 97%    BMI 22.47 kg/m  General appearance: alert, cooperative, appears stated age, and no distress Head: Normocephalic, without obvious abnormality, atraumatic Eyes: conjunctivae/corneas clear. PERRL, EOM's intact. Fundi benign. Ears: normal TM's and external ear canals both ears Nose: Nares normal. Septum midline. Mucosa normal. No drainage or sinus tenderness. Throat: lips, mucosa, and tongue normal; teeth and gums normal Neck: no adenopathy, no carotid bruit, no JVD, supple, symmetrical, trachea midline, and thyroid not enlarged, symmetric, no tenderness/mass/nodules Back: symmetric, no curvature. ROM normal. No CVA tenderness. Lungs: clear to auscultation bilaterally Heart: regular rate and rhythm, S1, S2 normal, no murmur, click, rub or gallop Abdomen: soft, non-tender; bowel sounds normal; no masses,  no organomegaly Extremities: extremities normal, atraumatic, no cyanosis or edema Pulses: 2+ and symmetric Skin: Skin color, texture, turgor normal. No rashes or lesions Lymph nodes: Cervical, supraclavicular, and axillary nodes normal. Neurologic: Alert and oriented X 3, normal strength and tone. Normal symmetric reflexes. Normal coordination and gait    Assessment:    Healthy female exam.      Plan:    Ghm utd Check labs  See After Visit Summary for Counseling Recommendations   1. Chronic nonintractable headache, unspecified headache  type stable - rizatriptan (MAXALT) 5 MG tablet; Take 1 tablet (5 mg total) by mouth as needed for migraine. May repeat in 2 hours if needed  Dispense: 10 tablet; Refill: 5  2. Colon cancer screening   - Ambulatory referral to Gastroenterology  3. Preventative health care See above  - CBC with Differential/Platelet; Future - Comprehensive metabolic panel; Future - Lipid panel; Future - TSH; Future  4. Need for hepatitis C screening test   - Hepatitis C antibody  5. Need for tetanus booster   - Tdap vaccine greater than or equal to 7yo IM  6. History of tobacco abuse Pt aware her ins mostly likely will not cover it because she is not yet 48   - CT CHEST LUNG CA SCREEN LOW DOSE W/O CM; Future

## 2021-11-26 ENCOUNTER — Other Ambulatory Visit: Payer: Self-pay | Admitting: Neurology

## 2021-11-27 ENCOUNTER — Other Ambulatory Visit (INDEPENDENT_AMBULATORY_CARE_PROVIDER_SITE_OTHER): Payer: 59

## 2021-11-27 DIAGNOSIS — E785 Hyperlipidemia, unspecified: Secondary | ICD-10-CM

## 2021-11-27 DIAGNOSIS — Z0000 Encounter for general adult medical examination without abnormal findings: Secondary | ICD-10-CM

## 2021-11-27 LAB — COMPREHENSIVE METABOLIC PANEL
ALT: 15 U/L (ref 0–35)
AST: 15 U/L (ref 0–37)
Albumin: 4.3 g/dL (ref 3.5–5.2)
Alkaline Phosphatase: 61 U/L (ref 39–117)
BUN: 13 mg/dL (ref 6–23)
CO2: 30 mEq/L (ref 19–32)
Calcium: 9.3 mg/dL (ref 8.4–10.5)
Chloride: 104 mEq/L (ref 96–112)
Creatinine, Ser: 0.86 mg/dL (ref 0.40–1.20)
GFR: 80.32 mL/min (ref >60.00)
Glucose, Bld: 101 mg/dL — ABNORMAL HIGH (ref 70–99)
Potassium: 4.4 mEq/L (ref 3.5–5.1)
Sodium: 139 mEq/L (ref 135–145)
Total Bilirubin: 0.6 mg/dL (ref 0.2–1.2)
Total Protein: 6.8 g/dL (ref 6.0–8.3)

## 2021-11-27 LAB — LIPID PANEL
Cholesterol: 191 mg/dL (ref 0–200)
HDL: 58.9 mg/dL (ref >39.00)
LDL Cholesterol: 111 mg/dL — ABNORMAL HIGH (ref 0–99)
NonHDL: 132
Total CHOL/HDL Ratio: 3
Triglycerides: 104 mg/dL (ref 0.0–149.0)
VLDL: 20.8 mg/dL (ref 0.0–40.0)

## 2021-11-27 LAB — CBC WITH DIFFERENTIAL/PLATELET
Basophils Absolute: 0 10*3/uL (ref 0.0–0.1)
Basophils Relative: 0.4 % (ref 0.0–3.0)
Eosinophils Absolute: 0.2 10*3/uL (ref 0.0–0.7)
Eosinophils Relative: 2.1 % (ref 0.0–5.0)
HCT: 41.3 % (ref 36.0–46.0)
Hemoglobin: 13.8 g/dL (ref 12.0–15.0)
Lymphocytes Relative: 23.3 % (ref 12.0–46.0)
Lymphs Abs: 1.8 10*3/uL (ref 0.7–4.0)
MCHC: 33.4 g/dL (ref 30.0–36.0)
MCV: 85.1 fl (ref 78.0–100.0)
Monocytes Absolute: 0.5 10*3/uL (ref 0.1–1.0)
Monocytes Relative: 6 % (ref 3.0–12.0)
Neutro Abs: 5.1 10*3/uL (ref 1.4–7.7)
Neutrophils Relative %: 68.2 % (ref 43.0–77.0)
Platelets: 248 10*3/uL (ref 150.0–400.0)
RBC: 4.86 Mil/uL (ref 3.87–5.11)
RDW: 14 % (ref 11.5–15.5)
WBC: 7.5 10*3/uL (ref 4.0–10.5)

## 2021-11-27 LAB — TSH: TSH: 3.56 u[IU]/mL (ref 0.35–5.50)

## 2021-11-30 DIAGNOSIS — Z01419 Encounter for gynecological examination (general) (routine) without abnormal findings: Secondary | ICD-10-CM | POA: Diagnosis not present

## 2021-11-30 DIAGNOSIS — R69 Illness, unspecified: Secondary | ICD-10-CM | POA: Diagnosis not present

## 2021-11-30 DIAGNOSIS — G43829 Menstrual migraine, not intractable, without status migrainosus: Secondary | ICD-10-CM | POA: Diagnosis not present

## 2021-11-30 DIAGNOSIS — D241 Benign neoplasm of right breast: Secondary | ICD-10-CM | POA: Diagnosis not present

## 2021-11-30 DIAGNOSIS — N393 Stress incontinence (female) (male): Secondary | ICD-10-CM | POA: Diagnosis not present

## 2021-11-30 DIAGNOSIS — Z1231 Encounter for screening mammogram for malignant neoplasm of breast: Secondary | ICD-10-CM | POA: Diagnosis not present

## 2021-11-30 DIAGNOSIS — Z1389 Encounter for screening for other disorder: Secondary | ICD-10-CM | POA: Diagnosis not present

## 2021-11-30 DIAGNOSIS — Z130 Encounter for screening for diseases of the blood and blood-forming organs and certain disorders involving the immune mechanism: Secondary | ICD-10-CM | POA: Diagnosis not present

## 2021-11-30 DIAGNOSIS — Z6822 Body mass index (BMI) 22.0-22.9, adult: Secondary | ICD-10-CM | POA: Diagnosis not present

## 2021-12-20 ENCOUNTER — Other Ambulatory Visit: Payer: Self-pay

## 2021-12-20 ENCOUNTER — Emergency Department
Admission: EM | Admit: 2021-12-20 | Discharge: 2021-12-20 | Disposition: A | Discharge status: Home / Self Care | Payer: 59 | Source: Home / Self Care

## 2021-12-20 DIAGNOSIS — R309 Painful micturition, unspecified: Secondary | ICD-10-CM | POA: Diagnosis not present

## 2021-12-20 DIAGNOSIS — R300 Dysuria: Secondary | ICD-10-CM | POA: Diagnosis not present

## 2021-12-20 LAB — POCT URINALYSIS DIP (MANUAL ENTRY)
Bilirubin, UA: NEGATIVE
Blood, UA: NEGATIVE
Glucose, UA: NEGATIVE mg/dL
Ketones, POC UA: NEGATIVE mg/dL
Nitrite, UA: NEGATIVE
Protein Ur, POC: NEGATIVE mg/dL
Spec Grav, UA: 1.015 (ref 1.010–1.025)
Urobilinogen, UA: 0.2 E.U./dL
pH, UA: 7 (ref 5.0–8.0)

## 2021-12-20 MED ORDER — SULFAMETHOXAZOLE-TRIMETHOPRIM 800-160 MG PO TABS: 1.0000 | ORAL_TABLET | Freq: Two times a day (BID) | ORAL | 0 refills | Status: AC

## 2021-12-20 NOTE — ED Provider Notes (Signed)
[name]    CSN: [id number] Arrival date & time: 12/20/21  0945      History   Chief Complaint Chief Complaint  Patient presents with   Dysuria    Pain with urination, burning with urination and urinary frequency. X5 days    HPI Mckenzie Harris is a 46 y.o. female.   HPI 46 year old female presents with dysuria and urinary frequency for 5 days.  Past Medical History:  Diagnosis Date   Anxiety    HSV-2 infection    PONV (postoperative nausea and vomiting)     Patient Active Problem List   Diagnosis Date Noted   Palpitations 01/15/2021   Dry mouth 09/05/2020   Dizziness 09/05/2020   Acute bronchitis 02/14/2016   Wellness examination 11/12/2015   ACUTE PHARYNGITIS 03/05/2010   GENITAL HERPES, HX OF 02/12/2010   VAGINITIS, BACTERIAL 11/10/2009   NEVI, MULTIPLE 10/21/2008   Anxiety state 05/05/2007   NECK PAIN, ACUTE 05/05/2007    Past Surgical History:  Procedure Laterality Date   BREAST BIOPSY     BREAST LUMPECTOMY Left 04/01/2020   Procedure: LEFT BREAST MASS EXCISION;  Surgeon: Rolm Bookbinder, MD;  Location: [facility];  Service: General;  Laterality: Left;   WISDOM TOOTH EXTRACTION      OB History   No obstetric history on file.      Home Medications    Prior to Admission medications   Medication Sig Start Date End Date Taking? Authorizing Provider  ALPRAZolam (XANAX) 0.25 MG tablet Take 1 tablet (0.25 mg total) by mouth at bedtime as needed for anxiety or sleep. 01/15/21  Yes Roma Schanz R, DO  escitalopram (LEXAPRO) 10 MG tablet Take 1 tablet (10 mg total) by mouth daily. 01/15/21  Yes Roma Schanz R, DO  rizatriptan (MAXALT) 5 MG tablet Take 1 tablet (5 mg total) by mouth as needed for migraine. May repeat in 2 hours if needed 11/24/21  Yes Lowne Lyndal Pulley R, DO  sulfamethoxazole-trimethoprim (BACTRIM DS) 800-160 MG tablet Take 1 tablet by mouth 2 (two) times daily for 3 days. 12/20/21 12/23/21 Yes Eliezer Lofts, FNP  topiramate (TOPAMAX) 50 MG tablet 1 tablet at bedtime 05/21/21  Yes Pieter Partridge, DO    Family History Family History  Problem Relation Age of Onset   Heart disease Father 29       heart attack    Hyperlipidemia Father    Hypertension Father    Atrial fibrillation Father        ablation   Lung cancer Father    COPD Father    Ovarian cancer Mother    Anxiety disorder Other    Heart attack Maternal Grandmother    Heart attack Maternal Grandfather     Social History Social History   Tobacco Use   Smoking status: Some Days    Packs/day: 1.00    Years: 30.00    Pack years: 30.00    Types: Cigarettes   Smokeless tobacco: Never  Substance Use Topics   Alcohol use: Yes    Comment: occassionally   Drug use: No     Allergies   Other   Review of Systems Review of Systems  Genitourinary:  Positive for dysuria and frequency.  All other systems reviewed and are negative.   Physical Exam Triage Vital Signs ED Triage Vitals  Enc Vitals Group     BP      Pulse      Resp  Temp      Temp src      SpO2      Weight      Height      Head Circumference      Peak Flow      Pain Score      Pain Loc      Pain Edu?      Excl. in [place]?    No data found.  Updated Vital Signs BP 97/60 (BP Location: Right Arm)    Pulse 66    Temp 98 F (36.7 C)    Resp 20    Ht 5\' 5"  (1.651 m)    Wt 130 lb (59 kg)    LMP 12/06/2021    SpO2 100%    BMI 21.63 kg/m    Physical Exam Vitals and nursing note reviewed.  Constitutional:      Appearance: Normal appearance. She is normal weight.  HENT:     Head: Normocephalic and atraumatic.     Mouth/Throat:     Mouth: Mucous membranes are moist.     Pharynx: Oropharynx is clear.  Eyes:     Extraocular Movements: Extraocular movements intact.     Conjunctiva/sclera: Conjunctivae normal.     Pupils: Pupils are equal, round, and reactive to light.  Cardiovascular:     Rate and Rhythm: Normal rate and regular rhythm.      Pulses: Normal pulses.     Heart sounds: Normal heart sounds. No murmur heard. Pulmonary:     Effort: Pulmonary effort is normal.     Breath sounds: Normal breath sounds.  Abdominal:     Tenderness: There is no right CVA tenderness or left CVA tenderness.  Musculoskeletal:     Cervical back: Normal range of motion and neck supple.  Skin:    General: Skin is warm and dry.  Neurological:     General: No focal deficit present.     Mental Status: She is alert and oriented to person, place, and time.     UC Treatments / Results  Labs (all labs ordered are listed, but only abnormal results are displayed) Labs Reviewed  POCT URINALYSIS DIP (MANUAL ENTRY) - Abnormal; Notable for the following components:      Result Value   Leukocytes, UA Trace (*)    All other components within normal limits  URINE CULTURE    EKG   Radiology No results found.  Procedures Procedures (including critical care time)  Medications Ordered in UC Medications - No data to display  Initial Impression / Assessment and Plan / UC Course  I have reviewed the triage vital signs and the nursing notes.  Pertinent labs & imaging results that were available during my care of the patient were reviewed by me and considered in my medical decision making (see chart for details).     MDM: 1.  Dysuria-we will treat empirically with Bactrim. Advised patient to take medication as directed with food to completion.  Encouraged patient to increase daily water intake while taking this medication.  Advised patient we will follow-up with her urine culture results once received.  Patient discharged home, hemodynamically stable. Final Clinical Impressions(s) / UC Diagnoses   Final diagnoses:  Pain with urination  Dysuria     Discharge Instructions      Advised patient to take medication as directed with food to completion.  Encouraged patient to increase daily water intake while taking this medication.  Advised  patient we will follow-up with her  urine culture results once received.     ED Prescriptions     Medication Sig Dispense Auth. Provider   sulfamethoxazole-trimethoprim (BACTRIM DS) 800-160 MG tablet Take 1 tablet by mouth 2 (two) times daily for 3 days. 6 tablet Eliezer Lofts, FNP      PDMP not reviewed this encounter.   Eliezer Lofts, [place] 12/20/21 1027

## 2021-12-20 NOTE — ED Triage Notes (Signed)
Pt states that she has some pain and burning with urination. Pt states that she also has some urinary frequency. X5 days

## 2021-12-20 NOTE — Discharge Instructions (Addendum)
Advised patient to take medication as directed with food to completion.  Encouraged patient to increase daily water intake while taking this medication.  Advised patient we will follow-up with her urine culture results once received.

## 2021-12-21 ENCOUNTER — Telehealth: Payer: Self-pay

## 2021-12-21 LAB — URINE CULTURE
MICRO NUMBER:: 13060039
SPECIMEN QUALITY:: ADEQUATE

## 2021-12-21 NOTE — Telephone Encounter (Signed)
Patient evaluated/treated at Urgent Care.    [facility] Night - ClientT ELEPHONE ADVICE RECORD AccessNurse Patient Name:Mckenzie Harris Initial Comment Caller states may have uti, is having symptoms of painful urination and discomfort, frequent urination. Dr Etter Sjogren Translation No Nurse Assessment Nurse: Aram Candela, RN, Brandin Date/Time Eilene Ghazi Time): 12/20/2021 8:59:51 AM Confirm and document reason for call. If symptomatic, describe symptoms. ---Caller states having symptoms of painful urination and discomfort, frequent urination since Wednesday. No fever. Does the patient have any new or worsening symptoms? ---Yes Will a triage be completed? ---Yes Related visit to physician within the last 2 weeks? ---Yes Does the PT have any chronic conditions? (i.e. diabetes, asthma, this includes High risk factors for pregnancy, etc.) ---No Is the patient pregnant or possibly pregnant? (Ask all females between the ages of 59-55) ---No Is this a behavioral health or substance abuse call? ---No Guidelines Guideline Title Affirmed Question Affirmed Notes Nurse Date/Time (Eastern Time) Urination Pain - Female All other patients with painful urination (Exception: [1] EITHER frequency or urgency AND [2] has on-call doctor) Aram Candela, RN, Brandin 12/20/2021 9:02:08 AM Disp. Time Eilene Ghazi Time) Disposition Final User PLEASE NOTE: All timestamps contained within this report are represented as [place] Standard Time. CONFIDENTIALTY NOTICE: This fax transmission is intended only for the addressee. It contains information that is legally privileged, confidential or otherwise protected from use or disclosure. If you are not the intended recipient, you are strictly prohibited from reviewing, disclosing, copying using or disseminating any of this information or taking any action in reliance on or regarding this information. If you have received this fax in error, please notify us  immediately by telephone so that we can arrange for its return to [place]. Phone: [phone], Toll-Free: [phone], Fax: [phone] Page: 2 of 2 Call Id: [id number] 12/20/2021 9:09:37 AM See PCP within 24 Hours Yes Aram Candela, RN, Brandin Caller Disagree/Comply Comply Caller Understands Yes PreDisposition Home Care Care Advice Given Per Guideline SEE PCP WITHIN 24 HOURS: * IF OFFICE WILL BE OPEN: You need to be examined within the next 24 hours. Call your doctor (or NP/PA) when the office opens and make an appointment. REASSURANCE AND EDUCATION - POSSIBLE URINE INFECTION: * This could be an urinary tract infection. * You should see your doctor (or NP/PA) to be examined and tested. DRINK EXTRA FLUIDS: * Drink extra fluids. * Drink 8 to 10 cups (1,800 to 2,400 ml) of liquids a day. * Reason: This will water-down your urine and make it less painful to pass. If there is an infection, this will help wash out the germs from your bladder. DRINK EXTRA FLUIDS - EXTRA NOTES AND WARNINGS: * Increased fluid intake may be contraindicated in adults with renal failure or heart failure. * Discuss with your doctor (or NP/PA). CRANBERRY JUICE: * Some people think that drinking cranberry juice helps fight off urinary tract infections. * There is some research that shows cranberry juice might help prevent a urine infection. * However, there is not much evidence that it helps treat an infection. * If you wish to drink cranberry juice, here are the amounts you can try: * Cranberry Juice Cocktail: 8 oz (240 ml) twice a day. * Cranberry 100% Juice: 1 oz (30 ml) twice a day. CALL BACK IF: * Fever or back pain occurs * You become worse CARE ADVICE given per Urination Pain - Female (Adult) guideline. Referrals [facility] [place] [facility] [place]

## 2021-12-30 ENCOUNTER — Encounter: Payer: Self-pay | Admitting: Family Medicine

## 2022-01-11 DIAGNOSIS — N920 Excessive and frequent menstruation with regular cycle: Secondary | ICD-10-CM | POA: Diagnosis not present

## 2022-01-25 ENCOUNTER — Other Ambulatory Visit: Payer: Self-pay | Admitting: Family Medicine

## 2022-01-25 DIAGNOSIS — F419 Anxiety disorder, unspecified: Secondary | ICD-10-CM

## 2022-03-23 DIAGNOSIS — N920 Excessive and frequent menstruation with regular cycle: Secondary | ICD-10-CM | POA: Diagnosis not present

## 2022-12-02 ENCOUNTER — Other Ambulatory Visit: Payer: Self-pay | Admitting: Family Medicine

## 2022-12-02 DIAGNOSIS — G8929 Other chronic pain: Secondary | ICD-10-CM

## 2023-02-16 ENCOUNTER — Other Ambulatory Visit: Payer: Self-pay | Admitting: Family Medicine

## 2023-02-16 DIAGNOSIS — R519 Headache, unspecified: Secondary | ICD-10-CM

## 2023-02-16 DIAGNOSIS — G8929 Other chronic pain: Secondary | ICD-10-CM

## 2023-10-08 ENCOUNTER — Other Ambulatory Visit: Payer: Self-pay | Admitting: Family Medicine

## 2023-10-08 DIAGNOSIS — G8929 Other chronic pain: Secondary | ICD-10-CM

## 2024-10-31 ENCOUNTER — Other Ambulatory Visit: Payer: Self-pay | Admitting: Family Medicine

## 2024-10-31 DIAGNOSIS — R519 Headache, unspecified: Secondary | ICD-10-CM
